# Patient Record
Sex: Female | Born: 1940 | Hispanic: No | Smoking: Never smoker
Health system: Southern US, Community
[De-identification: ages and names within clinical notes are randomized; demographics above are authoritative.]

## PROBLEM LIST (undated history)

## (undated) DIAGNOSIS — F329 Major depressive disorder, single episode, unspecified: Secondary | ICD-10-CM

## (undated) DIAGNOSIS — J410 Simple chronic bronchitis: Secondary | ICD-10-CM

## (undated) DIAGNOSIS — E119 Type 2 diabetes mellitus without complications: Secondary | ICD-10-CM

## (undated) DIAGNOSIS — F32A Depression, unspecified: Secondary | ICD-10-CM

## (undated) DIAGNOSIS — C3431 Malignant neoplasm of lower lobe, right bronchus or lung: Secondary | ICD-10-CM

## (undated) DIAGNOSIS — F419 Anxiety disorder, unspecified: Secondary | ICD-10-CM

## (undated) DIAGNOSIS — G4730 Sleep apnea, unspecified: Secondary | ICD-10-CM

## (undated) DIAGNOSIS — J181 Lobar pneumonia, unspecified organism: Secondary | ICD-10-CM

## (undated) DIAGNOSIS — R0600 Dyspnea, unspecified: Secondary | ICD-10-CM

## (undated) DIAGNOSIS — R053 Chronic cough: Secondary | ICD-10-CM

## (undated) DIAGNOSIS — J4540 Moderate persistent asthma, uncomplicated: Secondary | ICD-10-CM

## (undated) DIAGNOSIS — I10 Essential (primary) hypertension: Secondary | ICD-10-CM

## (undated) DIAGNOSIS — R05 Cough: Secondary | ICD-10-CM

## (undated) HISTORY — DX: Type 2 diabetes mellitus without complications: E11.9

## (undated) HISTORY — PX: TONSILLECTOMY: SUR1361

## (undated) HISTORY — DX: Simple chronic bronchitis: J41.0

## (undated) HISTORY — DX: Essential (primary) hypertension: I10

## (undated) HISTORY — DX: Moderate persistent asthma, uncomplicated: J45.40

## (undated) HISTORY — DX: Chronic cough: R05.3

## (undated) HISTORY — PX: BILATERAL SALPINGOOPHORECTOMY: SHX1223

## (undated) HISTORY — PX: APPENDECTOMY: SHX54

## (undated) HISTORY — DX: Malignant neoplasm of lower lobe, right bronchus or lung: C34.31

## (undated) HISTORY — DX: Lobar pneumonia, unspecified organism: J18.1

## (undated) HISTORY — PX: ABDOMINAL HYSTERECTOMY: SHX81

---
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
## 1998-04-15 ENCOUNTER — Other Ambulatory Visit: Admission: RE | Admit: 1998-04-15 | Discharge: 1998-04-15 | Payer: Self-pay | Admitting: Gynecology

## 1999-04-28 ENCOUNTER — Other Ambulatory Visit: Admission: RE | Admit: 1999-04-28 | Discharge: 1999-04-28 | Payer: Self-pay | Admitting: Gynecology

## 2000-05-03 ENCOUNTER — Other Ambulatory Visit: Admission: RE | Admit: 2000-05-03 | Discharge: 2000-05-03 | Payer: Self-pay | Admitting: Gynecology

## 2001-05-16 ENCOUNTER — Other Ambulatory Visit: Admission: RE | Admit: 2001-05-16 | Discharge: 2001-05-16 | Payer: Self-pay | Admitting: Gynecology

## 2002-06-12 ENCOUNTER — Other Ambulatory Visit: Admission: RE | Admit: 2002-06-12 | Discharge: 2002-06-12 | Payer: Self-pay | Admitting: Gynecology

## 2003-08-06 ENCOUNTER — Other Ambulatory Visit: Admission: RE | Admit: 2003-08-06 | Discharge: 2003-08-06 | Payer: Self-pay | Admitting: Gynecology

## 2004-08-25 ENCOUNTER — Other Ambulatory Visit: Admission: RE | Admit: 2004-08-25 | Discharge: 2004-08-25 | Payer: Self-pay | Admitting: Gynecology

## 2004-08-25 ENCOUNTER — Encounter: Admission: RE | Admit: 2004-08-25 | Discharge: 2004-08-25 | Payer: Self-pay | Admitting: Gynecology

## 2004-09-14 ENCOUNTER — Ambulatory Visit: Admission: RE | Admit: 2004-09-14 | Discharge: 2004-09-14 | Payer: Self-pay | Admitting: Gynecology

## 2004-09-21 ENCOUNTER — Inpatient Hospital Stay (HOSPITAL_COMMUNITY): Admission: RE | Admit: 2004-09-21 | Discharge: 2004-09-23 | Payer: Self-pay | Admitting: Gynecology

## 2004-10-26 ENCOUNTER — Ambulatory Visit: Admission: RE | Admit: 2004-10-26 | Discharge: 2004-10-26 | Payer: Self-pay | Admitting: Gynecology

## 2005-09-07 ENCOUNTER — Other Ambulatory Visit: Admission: RE | Admit: 2005-09-07 | Discharge: 2005-09-07 | Payer: Self-pay | Admitting: Gynecology

## 2005-10-12 ENCOUNTER — Encounter: Admission: RE | Admit: 2005-10-12 | Discharge: 2005-10-12 | Payer: Self-pay | Admitting: Gynecology

## 2006-10-25 ENCOUNTER — Other Ambulatory Visit: Admission: RE | Admit: 2006-10-25 | Discharge: 2006-10-25 | Payer: Self-pay | Admitting: Gynecology

## 2012-02-15 DIAGNOSIS — E559 Vitamin D deficiency, unspecified: Secondary | ICD-10-CM | POA: Diagnosis not present

## 2012-02-15 DIAGNOSIS — E119 Type 2 diabetes mellitus without complications: Secondary | ICD-10-CM | POA: Diagnosis not present

## 2012-02-15 DIAGNOSIS — E785 Hyperlipidemia, unspecified: Secondary | ICD-10-CM | POA: Diagnosis not present

## 2012-02-15 DIAGNOSIS — I10 Essential (primary) hypertension: Secondary | ICD-10-CM | POA: Diagnosis not present

## 2012-02-22 DIAGNOSIS — Z0000 Encounter for general adult medical examination without abnormal findings: Secondary | ICD-10-CM | POA: Diagnosis not present

## 2012-02-22 DIAGNOSIS — E119 Type 2 diabetes mellitus without complications: Secondary | ICD-10-CM | POA: Diagnosis not present

## 2012-02-22 DIAGNOSIS — E785 Hyperlipidemia, unspecified: Secondary | ICD-10-CM | POA: Diagnosis not present

## 2012-02-22 DIAGNOSIS — I10 Essential (primary) hypertension: Secondary | ICD-10-CM | POA: Diagnosis not present

## 2012-02-27 DIAGNOSIS — Z1212 Encounter for screening for malignant neoplasm of rectum: Secondary | ICD-10-CM | POA: Diagnosis not present

## 2012-05-17 DIAGNOSIS — H524 Presbyopia: Secondary | ICD-10-CM | POA: Diagnosis not present

## 2012-05-17 DIAGNOSIS — H2510 Age-related nuclear cataract, unspecified eye: Secondary | ICD-10-CM | POA: Diagnosis not present

## 2012-05-24 DIAGNOSIS — Z23 Encounter for immunization: Secondary | ICD-10-CM | POA: Diagnosis not present

## 2012-09-16 DIAGNOSIS — R21 Rash and other nonspecific skin eruption: Secondary | ICD-10-CM | POA: Diagnosis not present

## 2013-02-19 DIAGNOSIS — R059 Cough, unspecified: Secondary | ICD-10-CM | POA: Diagnosis not present

## 2013-02-19 DIAGNOSIS — R05 Cough: Secondary | ICD-10-CM | POA: Diagnosis not present

## 2013-03-07 DIAGNOSIS — I10 Essential (primary) hypertension: Secondary | ICD-10-CM | POA: Diagnosis not present

## 2013-03-07 DIAGNOSIS — E559 Vitamin D deficiency, unspecified: Secondary | ICD-10-CM | POA: Diagnosis not present

## 2013-03-07 DIAGNOSIS — E119 Type 2 diabetes mellitus without complications: Secondary | ICD-10-CM | POA: Diagnosis not present

## 2013-03-07 DIAGNOSIS — E785 Hyperlipidemia, unspecified: Secondary | ICD-10-CM | POA: Diagnosis not present

## 2013-03-14 DIAGNOSIS — Z1212 Encounter for screening for malignant neoplasm of rectum: Secondary | ICD-10-CM | POA: Diagnosis not present

## 2013-03-14 DIAGNOSIS — I10 Essential (primary) hypertension: Secondary | ICD-10-CM | POA: Diagnosis not present

## 2013-03-14 DIAGNOSIS — E785 Hyperlipidemia, unspecified: Secondary | ICD-10-CM | POA: Diagnosis not present

## 2013-03-14 DIAGNOSIS — E119 Type 2 diabetes mellitus without complications: Secondary | ICD-10-CM | POA: Diagnosis not present

## 2013-03-14 DIAGNOSIS — E559 Vitamin D deficiency, unspecified: Secondary | ICD-10-CM | POA: Diagnosis not present

## 2013-03-14 DIAGNOSIS — Z0000 Encounter for general adult medical examination without abnormal findings: Secondary | ICD-10-CM | POA: Diagnosis not present

## 2013-03-14 DIAGNOSIS — Z1331 Encounter for screening for depression: Secondary | ICD-10-CM | POA: Diagnosis not present

## 2013-03-14 DIAGNOSIS — Z6831 Body mass index (BMI) 31.0-31.9, adult: Secondary | ICD-10-CM | POA: Diagnosis not present

## 2013-03-14 DIAGNOSIS — Z79899 Other long term (current) drug therapy: Secondary | ICD-10-CM | POA: Diagnosis not present

## 2013-05-22 DIAGNOSIS — H524 Presbyopia: Secondary | ICD-10-CM | POA: Diagnosis not present

## 2013-05-22 DIAGNOSIS — E119 Type 2 diabetes mellitus without complications: Secondary | ICD-10-CM | POA: Diagnosis not present

## 2013-05-23 DIAGNOSIS — Z23 Encounter for immunization: Secondary | ICD-10-CM | POA: Diagnosis not present

## 2013-07-16 DIAGNOSIS — B351 Tinea unguium: Secondary | ICD-10-CM | POA: Diagnosis not present

## 2013-07-24 DIAGNOSIS — B351 Tinea unguium: Secondary | ICD-10-CM | POA: Diagnosis not present

## 2013-07-30 DIAGNOSIS — B351 Tinea unguium: Secondary | ICD-10-CM | POA: Diagnosis not present

## 2013-08-09 DIAGNOSIS — R197 Diarrhea, unspecified: Secondary | ICD-10-CM | POA: Diagnosis not present

## 2013-09-20 DIAGNOSIS — Z79899 Other long term (current) drug therapy: Secondary | ICD-10-CM | POA: Diagnosis not present

## 2013-09-20 DIAGNOSIS — B351 Tinea unguium: Secondary | ICD-10-CM | POA: Diagnosis not present

## 2014-03-19 DIAGNOSIS — E119 Type 2 diabetes mellitus without complications: Secondary | ICD-10-CM | POA: Diagnosis not present

## 2014-03-19 DIAGNOSIS — I10 Essential (primary) hypertension: Secondary | ICD-10-CM | POA: Diagnosis not present

## 2014-03-19 DIAGNOSIS — Z6830 Body mass index (BMI) 30.0-30.9, adult: Secondary | ICD-10-CM | POA: Diagnosis not present

## 2014-03-19 DIAGNOSIS — Z1331 Encounter for screening for depression: Secondary | ICD-10-CM | POA: Diagnosis not present

## 2014-03-19 DIAGNOSIS — Z780 Asymptomatic menopausal state: Secondary | ICD-10-CM | POA: Diagnosis not present

## 2014-03-19 DIAGNOSIS — Z79899 Other long term (current) drug therapy: Secondary | ICD-10-CM | POA: Diagnosis not present

## 2014-03-19 DIAGNOSIS — E559 Vitamin D deficiency, unspecified: Secondary | ICD-10-CM | POA: Diagnosis not present

## 2014-03-19 DIAGNOSIS — Z0000 Encounter for general adult medical examination without abnormal findings: Secondary | ICD-10-CM | POA: Diagnosis not present

## 2014-03-19 DIAGNOSIS — E785 Hyperlipidemia, unspecified: Secondary | ICD-10-CM | POA: Diagnosis not present

## 2014-03-25 DIAGNOSIS — Z1212 Encounter for screening for malignant neoplasm of rectum: Secondary | ICD-10-CM | POA: Diagnosis not present

## 2014-05-28 DIAGNOSIS — H2510 Age-related nuclear cataract, unspecified eye: Secondary | ICD-10-CM | POA: Diagnosis not present

## 2014-05-28 DIAGNOSIS — E119 Type 2 diabetes mellitus without complications: Secondary | ICD-10-CM | POA: Diagnosis not present

## 2014-06-18 DIAGNOSIS — Z23 Encounter for immunization: Secondary | ICD-10-CM | POA: Diagnosis not present

## 2015-04-02 DIAGNOSIS — Z1389 Encounter for screening for other disorder: Secondary | ICD-10-CM | POA: Diagnosis not present

## 2015-04-02 DIAGNOSIS — Z0000 Encounter for general adult medical examination without abnormal findings: Secondary | ICD-10-CM | POA: Diagnosis not present

## 2015-04-02 DIAGNOSIS — E785 Hyperlipidemia, unspecified: Secondary | ICD-10-CM | POA: Diagnosis not present

## 2015-04-02 DIAGNOSIS — M25561 Pain in right knee: Secondary | ICD-10-CM | POA: Diagnosis not present

## 2015-04-02 DIAGNOSIS — I10 Essential (primary) hypertension: Secondary | ICD-10-CM | POA: Diagnosis not present

## 2015-04-02 DIAGNOSIS — Z6829 Body mass index (BMI) 29.0-29.9, adult: Secondary | ICD-10-CM | POA: Diagnosis not present

## 2015-04-02 DIAGNOSIS — E559 Vitamin D deficiency, unspecified: Secondary | ICD-10-CM | POA: Diagnosis not present

## 2015-04-02 DIAGNOSIS — E119 Type 2 diabetes mellitus without complications: Secondary | ICD-10-CM | POA: Diagnosis not present

## 2015-04-03 DIAGNOSIS — Z1212 Encounter for screening for malignant neoplasm of rectum: Secondary | ICD-10-CM | POA: Diagnosis not present

## 2015-05-27 DIAGNOSIS — Z23 Encounter for immunization: Secondary | ICD-10-CM | POA: Diagnosis not present

## 2015-07-15 DIAGNOSIS — E119 Type 2 diabetes mellitus without complications: Secondary | ICD-10-CM | POA: Diagnosis not present

## 2015-07-15 DIAGNOSIS — H5213 Myopia, bilateral: Secondary | ICD-10-CM | POA: Diagnosis not present

## 2015-07-15 DIAGNOSIS — H524 Presbyopia: Secondary | ICD-10-CM | POA: Diagnosis not present

## 2016-04-07 DIAGNOSIS — E559 Vitamin D deficiency, unspecified: Secondary | ICD-10-CM | POA: Diagnosis not present

## 2016-04-07 DIAGNOSIS — M25561 Pain in right knee: Secondary | ICD-10-CM | POA: Diagnosis not present

## 2016-04-07 DIAGNOSIS — Z1389 Encounter for screening for other disorder: Secondary | ICD-10-CM | POA: Diagnosis not present

## 2016-04-07 DIAGNOSIS — E784 Other hyperlipidemia: Secondary | ICD-10-CM | POA: Diagnosis not present

## 2016-04-07 DIAGNOSIS — M25562 Pain in left knee: Secondary | ICD-10-CM | POA: Diagnosis not present

## 2016-04-07 DIAGNOSIS — Z6831 Body mass index (BMI) 31.0-31.9, adult: Secondary | ICD-10-CM | POA: Diagnosis not present

## 2016-04-07 DIAGNOSIS — I10 Essential (primary) hypertension: Secondary | ICD-10-CM | POA: Diagnosis not present

## 2016-04-07 DIAGNOSIS — Z0000 Encounter for general adult medical examination without abnormal findings: Secondary | ICD-10-CM | POA: Diagnosis not present

## 2016-04-07 DIAGNOSIS — E119 Type 2 diabetes mellitus without complications: Secondary | ICD-10-CM | POA: Diagnosis not present

## 2016-04-07 DIAGNOSIS — F5104 Psychophysiologic insomnia: Secondary | ICD-10-CM | POA: Diagnosis not present

## 2016-04-20 DIAGNOSIS — Z1212 Encounter for screening for malignant neoplasm of rectum: Secondary | ICD-10-CM | POA: Diagnosis not present

## 2016-05-03 DIAGNOSIS — I10 Essential (primary) hypertension: Secondary | ICD-10-CM | POA: Diagnosis not present

## 2016-05-05 DIAGNOSIS — I10 Essential (primary) hypertension: Secondary | ICD-10-CM | POA: Diagnosis not present

## 2016-06-04 DIAGNOSIS — Z23 Encounter for immunization: Secondary | ICD-10-CM | POA: Diagnosis not present

## 2016-06-15 DIAGNOSIS — G4733 Obstructive sleep apnea (adult) (pediatric): Secondary | ICD-10-CM | POA: Diagnosis not present

## 2016-07-20 DIAGNOSIS — H2513 Age-related nuclear cataract, bilateral: Secondary | ICD-10-CM | POA: Diagnosis not present

## 2016-07-20 DIAGNOSIS — H5213 Myopia, bilateral: Secondary | ICD-10-CM | POA: Diagnosis not present

## 2016-08-01 DIAGNOSIS — E669 Obesity, unspecified: Secondary | ICD-10-CM | POA: Diagnosis not present

## 2016-08-01 DIAGNOSIS — G4733 Obstructive sleep apnea (adult) (pediatric): Secondary | ICD-10-CM | POA: Diagnosis not present

## 2016-08-01 DIAGNOSIS — R0683 Snoring: Secondary | ICD-10-CM | POA: Diagnosis not present

## 2016-10-06 DIAGNOSIS — G4733 Obstructive sleep apnea (adult) (pediatric): Secondary | ICD-10-CM | POA: Diagnosis not present

## 2016-10-10 DIAGNOSIS — E784 Other hyperlipidemia: Secondary | ICD-10-CM | POA: Diagnosis not present

## 2016-10-10 DIAGNOSIS — E559 Vitamin D deficiency, unspecified: Secondary | ICD-10-CM | POA: Diagnosis not present

## 2016-10-10 DIAGNOSIS — E1151 Type 2 diabetes mellitus with diabetic peripheral angiopathy without gangrene: Secondary | ICD-10-CM | POA: Diagnosis not present

## 2016-10-10 DIAGNOSIS — I7389 Other specified peripheral vascular diseases: Secondary | ICD-10-CM | POA: Diagnosis not present

## 2016-10-10 DIAGNOSIS — Z1389 Encounter for screening for other disorder: Secondary | ICD-10-CM | POA: Diagnosis not present

## 2016-10-10 DIAGNOSIS — I10 Essential (primary) hypertension: Secondary | ICD-10-CM | POA: Diagnosis not present

## 2016-10-10 DIAGNOSIS — Z6830 Body mass index (BMI) 30.0-30.9, adult: Secondary | ICD-10-CM | POA: Diagnosis not present

## 2016-10-10 DIAGNOSIS — G4733 Obstructive sleep apnea (adult) (pediatric): Secondary | ICD-10-CM | POA: Diagnosis not present

## 2016-11-04 DIAGNOSIS — E118 Type 2 diabetes mellitus with unspecified complications: Secondary | ICD-10-CM | POA: Diagnosis not present

## 2017-01-19 DIAGNOSIS — E1165 Type 2 diabetes mellitus with hyperglycemia: Secondary | ICD-10-CM | POA: Diagnosis not present

## 2017-01-19 DIAGNOSIS — E669 Obesity, unspecified: Secondary | ICD-10-CM | POA: Diagnosis not present

## 2017-01-19 DIAGNOSIS — G4733 Obstructive sleep apnea (adult) (pediatric): Secondary | ICD-10-CM | POA: Diagnosis not present

## 2017-01-19 DIAGNOSIS — I10 Essential (primary) hypertension: Secondary | ICD-10-CM | POA: Diagnosis not present

## 2017-02-06 DIAGNOSIS — Z79899 Other long term (current) drug therapy: Secondary | ICD-10-CM | POA: Diagnosis not present

## 2017-02-06 DIAGNOSIS — E1165 Type 2 diabetes mellitus with hyperglycemia: Secondary | ICD-10-CM | POA: Diagnosis not present

## 2017-02-06 DIAGNOSIS — E785 Hyperlipidemia, unspecified: Secondary | ICD-10-CM | POA: Diagnosis not present

## 2017-02-10 DIAGNOSIS — E1165 Type 2 diabetes mellitus with hyperglycemia: Secondary | ICD-10-CM | POA: Diagnosis not present

## 2017-02-10 DIAGNOSIS — R05 Cough: Secondary | ICD-10-CM | POA: Diagnosis not present

## 2017-02-10 DIAGNOSIS — I10 Essential (primary) hypertension: Secondary | ICD-10-CM | POA: Diagnosis not present

## 2017-02-10 DIAGNOSIS — E669 Obesity, unspecified: Secondary | ICD-10-CM | POA: Diagnosis not present

## 2017-02-10 DIAGNOSIS — E785 Hyperlipidemia, unspecified: Secondary | ICD-10-CM | POA: Diagnosis not present

## 2017-05-12 DIAGNOSIS — Z79899 Other long term (current) drug therapy: Secondary | ICD-10-CM | POA: Diagnosis not present

## 2017-05-12 DIAGNOSIS — E1165 Type 2 diabetes mellitus with hyperglycemia: Secondary | ICD-10-CM | POA: Diagnosis not present

## 2017-05-17 DIAGNOSIS — E669 Obesity, unspecified: Secondary | ICD-10-CM | POA: Diagnosis not present

## 2017-05-17 DIAGNOSIS — E1165 Type 2 diabetes mellitus with hyperglycemia: Secondary | ICD-10-CM | POA: Diagnosis not present

## 2017-05-17 DIAGNOSIS — E785 Hyperlipidemia, unspecified: Secondary | ICD-10-CM | POA: Diagnosis not present

## 2017-05-17 DIAGNOSIS — I10 Essential (primary) hypertension: Secondary | ICD-10-CM | POA: Diagnosis not present

## 2017-07-20 DIAGNOSIS — H524 Presbyopia: Secondary | ICD-10-CM | POA: Diagnosis not present

## 2017-07-20 DIAGNOSIS — H2513 Age-related nuclear cataract, bilateral: Secondary | ICD-10-CM | POA: Diagnosis not present

## 2017-07-20 DIAGNOSIS — E119 Type 2 diabetes mellitus without complications: Secondary | ICD-10-CM | POA: Diagnosis not present

## 2017-08-01 DIAGNOSIS — I10 Essential (primary) hypertension: Secondary | ICD-10-CM | POA: Diagnosis not present

## 2017-08-01 DIAGNOSIS — G4733 Obstructive sleep apnea (adult) (pediatric): Secondary | ICD-10-CM | POA: Diagnosis not present

## 2017-08-01 DIAGNOSIS — E669 Obesity, unspecified: Secondary | ICD-10-CM | POA: Diagnosis not present

## 2017-11-14 DIAGNOSIS — E785 Hyperlipidemia, unspecified: Secondary | ICD-10-CM | POA: Diagnosis not present

## 2017-11-14 DIAGNOSIS — Z79899 Other long term (current) drug therapy: Secondary | ICD-10-CM | POA: Diagnosis not present

## 2017-11-14 DIAGNOSIS — E1165 Type 2 diabetes mellitus with hyperglycemia: Secondary | ICD-10-CM | POA: Diagnosis not present

## 2017-11-21 DIAGNOSIS — E1165 Type 2 diabetes mellitus with hyperglycemia: Secondary | ICD-10-CM | POA: Diagnosis not present

## 2017-11-21 DIAGNOSIS — R200 Anesthesia of skin: Secondary | ICD-10-CM | POA: Diagnosis not present

## 2017-11-21 DIAGNOSIS — I10 Essential (primary) hypertension: Secondary | ICD-10-CM | POA: Diagnosis not present

## 2017-11-21 DIAGNOSIS — E785 Hyperlipidemia, unspecified: Secondary | ICD-10-CM | POA: Diagnosis not present

## 2018-02-15 DIAGNOSIS — E785 Hyperlipidemia, unspecified: Secondary | ICD-10-CM | POA: Diagnosis not present

## 2018-02-15 DIAGNOSIS — Z79899 Other long term (current) drug therapy: Secondary | ICD-10-CM | POA: Diagnosis not present

## 2018-02-15 DIAGNOSIS — E1165 Type 2 diabetes mellitus with hyperglycemia: Secondary | ICD-10-CM | POA: Diagnosis not present

## 2018-02-20 DIAGNOSIS — E1165 Type 2 diabetes mellitus with hyperglycemia: Secondary | ICD-10-CM | POA: Diagnosis not present

## 2018-02-20 DIAGNOSIS — I10 Essential (primary) hypertension: Secondary | ICD-10-CM | POA: Diagnosis not present

## 2018-02-20 DIAGNOSIS — Z0000 Encounter for general adult medical examination without abnormal findings: Secondary | ICD-10-CM | POA: Diagnosis not present

## 2018-02-20 DIAGNOSIS — E785 Hyperlipidemia, unspecified: Secondary | ICD-10-CM | POA: Diagnosis not present

## 2018-03-19 DIAGNOSIS — I10 Essential (primary) hypertension: Secondary | ICD-10-CM | POA: Diagnosis not present

## 2018-03-19 DIAGNOSIS — L309 Dermatitis, unspecified: Secondary | ICD-10-CM | POA: Diagnosis not present

## 2018-05-08 DIAGNOSIS — I10 Essential (primary) hypertension: Secondary | ICD-10-CM | POA: Diagnosis not present

## 2018-05-08 DIAGNOSIS — L259 Unspecified contact dermatitis, unspecified cause: Secondary | ICD-10-CM | POA: Diagnosis not present

## 2018-06-04 DIAGNOSIS — Z23 Encounter for immunization: Secondary | ICD-10-CM | POA: Diagnosis not present

## 2018-06-05 DIAGNOSIS — H2513 Age-related nuclear cataract, bilateral: Secondary | ICD-10-CM | POA: Diagnosis not present

## 2018-06-05 DIAGNOSIS — E119 Type 2 diabetes mellitus without complications: Secondary | ICD-10-CM | POA: Diagnosis not present

## 2018-08-07 DIAGNOSIS — E1165 Type 2 diabetes mellitus with hyperglycemia: Secondary | ICD-10-CM | POA: Diagnosis not present

## 2018-08-07 DIAGNOSIS — I10 Essential (primary) hypertension: Secondary | ICD-10-CM | POA: Diagnosis not present

## 2018-08-07 DIAGNOSIS — E669 Obesity, unspecified: Secondary | ICD-10-CM | POA: Diagnosis not present

## 2018-08-07 DIAGNOSIS — G4733 Obstructive sleep apnea (adult) (pediatric): Secondary | ICD-10-CM | POA: Diagnosis not present

## 2018-08-07 DIAGNOSIS — E785 Hyperlipidemia, unspecified: Secondary | ICD-10-CM | POA: Diagnosis not present

## 2018-08-24 DIAGNOSIS — Z79899 Other long term (current) drug therapy: Secondary | ICD-10-CM | POA: Diagnosis not present

## 2018-08-24 DIAGNOSIS — E785 Hyperlipidemia, unspecified: Secondary | ICD-10-CM | POA: Diagnosis not present

## 2018-08-24 DIAGNOSIS — E1165 Type 2 diabetes mellitus with hyperglycemia: Secondary | ICD-10-CM | POA: Diagnosis not present

## 2018-08-27 DIAGNOSIS — R252 Cramp and spasm: Secondary | ICD-10-CM | POA: Diagnosis not present

## 2018-08-27 DIAGNOSIS — E785 Hyperlipidemia, unspecified: Secondary | ICD-10-CM | POA: Diagnosis not present

## 2018-08-27 DIAGNOSIS — E1165 Type 2 diabetes mellitus with hyperglycemia: Secondary | ICD-10-CM | POA: Diagnosis not present

## 2018-08-27 DIAGNOSIS — I10 Essential (primary) hypertension: Secondary | ICD-10-CM | POA: Diagnosis not present

## 2019-05-28 ENCOUNTER — Other Ambulatory Visit: Payer: Self-pay

## 2019-05-28 ENCOUNTER — Encounter: Payer: Self-pay | Admitting: *Deleted

## 2019-05-28 ENCOUNTER — Institutional Professional Consult (permissible substitution) (INDEPENDENT_AMBULATORY_CARE_PROVIDER_SITE_OTHER): Payer: Medicare Other | Admitting: Thoracic Surgery (Cardiothoracic Vascular Surgery)

## 2019-05-28 ENCOUNTER — Other Ambulatory Visit: Payer: Self-pay | Admitting: *Deleted

## 2019-05-28 ENCOUNTER — Encounter: Payer: Self-pay | Admitting: Thoracic Surgery (Cardiothoracic Vascular Surgery)

## 2019-05-28 VITALS — BP 128/79 | HR 106 | Temp 97.7°F | Ht 61.0 in | Wt 189.0 lb

## 2019-05-28 DIAGNOSIS — R053 Chronic cough: Secondary | ICD-10-CM | POA: Insufficient documentation

## 2019-05-28 DIAGNOSIS — J410 Simple chronic bronchitis: Secondary | ICD-10-CM | POA: Insufficient documentation

## 2019-05-28 DIAGNOSIS — I10 Essential (primary) hypertension: Secondary | ICD-10-CM

## 2019-05-28 DIAGNOSIS — E119 Type 2 diabetes mellitus without complications: Secondary | ICD-10-CM | POA: Insufficient documentation

## 2019-05-28 DIAGNOSIS — R05 Cough: Secondary | ICD-10-CM

## 2019-05-28 DIAGNOSIS — J181 Lobar pneumonia, unspecified organism: Secondary | ICD-10-CM

## 2019-05-28 DIAGNOSIS — C3431 Malignant neoplasm of lower lobe, right bronchus or lung: Secondary | ICD-10-CM

## 2019-05-28 DIAGNOSIS — J4540 Moderate persistent asthma, uncomplicated: Secondary | ICD-10-CM

## 2019-05-28 NOTE — Progress Notes (Signed)
PCP is Birdie Sons, MD Referring Provider is Birdie Sons, MD  Pulmonology: Elisabeth Most, MD, [place], [place] Oncology: Joselyn Glassman, MD [place], [place] Chief Complaint  Patient presents with  . Lung Cancer    RLLobe per [facility] 05/08/19.Marland KitchenMarland KitchenCT CHEST 8/18/PET 05/23/19.Marland KitchenMarland KitchenPFT 04/07/19    HPI: Cheyenne Hutchinson is sent for consultation regarding adenocarcinoma of the right lower lobe.  Cheyenne Hutchinson is a 78 year old non-smoker with a past medical history significant for hypertension, type II non-insulin-dependent diabetes without complication, asthma, and sleep apnea requiring CPAP at night.  She was in her usual state of health until the past winter.  In February she developed a persistent cough.  She was treated with antibiotics without improvement.  She had a chest x-ray which showed a probable right lower lobe pneumonia.  She was treated with Zithromax, but still did not improve.  She was referred to pulmonology, Dr Cheyenne Hutchinson.  A trial of Symbicort and Singulair was not helpful.  A repeat chest x-ray showed persistent consolidation.  A CT of the chest was done which showed extensive consolidation in the right lower lobe.  Bronchoscopy was done on 05/08/2019.  One fragment of her transbronchial biopsy showed well differentiated adenocarcinoma with lepidic growth.  A PET CT showed that area was hypermetabolic.  There was no significant hilar or mediastinal adenopathy.  She continues to have a persistent cough.  She says she will get short of breath when she walks up a flight of stairs.  She can complete the flight but then takes a second to catch her breath.  Pulmonary function testing by Dr. Albertine Hutchinson showed an FEV1 of 111% of predicted.  Her DLCO was 52% of predicted.  She is not having any chest pain, pressure, or tightness.  She has no history of cardiac issues.  He has lost 5 pounds over the past 3 months.   Past Medical History:  Diagnosis Date  . Chronic cough   . Consolidation of right lower lobe of lung  ([place])   . DM (diabetes mellitus) ([place])   . HTN (hypertension)   . Moderate persistent asthma without complication   . 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 Simple chronic bronchitis ([place])     Past Surgical History:  Procedure Laterality Date  . ABDOMINAL HYSTERECTOMY    . APPENDECTOMY    . BILATERAL SALPINGOOPHORECTOMY Bilateral     Family History  Problem Relation Age of Onset  . Alzheimer's disease Mother   . Stroke Mother   . Heart disease Father   . Throat cancer Brother   . Healthy Brother   . Healthy Child     Social History Social History   Tobacco Use  . Smoking status: Never Smoker  . Smokeless tobacco: Never Used  Substance Use Topics  . Alcohol use: Yes    Comment: once a month  . Drug use: Never    Current Outpatient Medications  Medication Sig Dispense Refill  . benzonatate (TESSALON) 200 MG capsule Take 200 mg by mouth 3 (three) times daily as needed for cough.    . Calcium Citrate (CITRACAL PO) Take 4 tablets by mouth daily. PETITES    . ciclopirox (PENLAC) 8 % solution Apply topically at bedtime. Apply over nail and surrounding skin. Apply daily over previous coat. After seven (7) days, may remove with alcohol and continue cycle.    . COD LIVER OIL PO Take 3 capsules by mouth daily.    Marland Kitchen guaiFENesin-codeine (  ROBITUSSIN AC) 100-10 MG/5ML syrup Take 5 mLs by mouth 3 (three) times daily as needed for cough.    . Magnesium 400 MG CAPS Take 2 capsules by mouth daily.    . Multiple Vitamin (MULTIVITAMIN) tablet Take 1 tablet by mouth daily. WOMEN'S ONE A DAY    . vitamin E 1000 UNIT capsule Take 1,000 Units by mouth daily.    . Zinc 50 MG TABS Take 1 tablet by mouth daily.    Marland Kitchen glimepiride (AMARYL) 1 MG tablet Take 1 mg by mouth daily with breakfast. OR WITH THE FIRST MAIN MEAL OF THE DAY    . losartan (COZAAR) 50 MG tablet Take 50 mg by mouth daily.    . metFORMIN (GLUCOPHAGE) 500 MG tablet Take by mouth 2 (two) times  daily with a meal.    . NON FORMULARY Cpap DME mask as directed nasal QHS    . pioglitazone (ACTOS) 15 MG tablet Take 15 mg by mouth daily.    . simvastatin (ZOCOR) 40 MG tablet Take 40 mg by mouth daily at 6 PM.     No current facility-administered medications for this visit.     Allergies  Allergen Reactions  . Aspirin Itching and Swelling  . Tetanus Toxoids Swelling    Review of Systems  Constitutional: Positive for activity change (Mild) and unexpected weight change (Lost 5 pounds in 3 months).  HENT: Positive for voice change (Hoarse voice). Negative for trouble swallowing.   Respiratory: Positive for apnea (CPAP at night), cough (Persistent nonproductive), shortness of breath (With exertion) and wheezing.   Cardiovascular: Negative for chest pain and leg swelling.  Gastrointestinal: Negative for abdominal distention and abdominal pain.  Genitourinary: Negative for difficulty urinating and dysuria.  Musculoskeletal: Positive for myalgias (Leg cramps). Negative for arthralgias.  Neurological: Negative for seizures, syncope and weakness.  Hematological: Negative for adenopathy. Does not bruise/bleed easily.    BP 128/79 (BP Location: Right Arm, Patient Position: Sitting, Cuff Size: Large)   Pulse (!) 106   Temp 97.7 F (36.5 C)   Ht 5\' 1"  (1.549 m)   Wt 189 lb (85.7 kg)   SpO2 96%   BMI 35.71 kg/m  Physical Exam Vitals signs reviewed.  Constitutional:      General: She is not in acute distress.    Comments: Frequent coughing  HENT:     Head: Normocephalic and atraumatic.     Comments: Wearing surgical mask Eyes:     General: No scleral icterus.    Extraocular Movements: Extraocular movements intact.  Neck:     Musculoskeletal: Neck supple.  Cardiovascular:     Rate and Rhythm: Regular rhythm. Tachycardia present.     Heart sounds: No murmur. No friction rub. No gallop.   Pulmonary:     Breath sounds: No wheezing or rales.     Comments: Absent breath sounds  right base Abdominal:     General: There is no distension.     Palpations: Abdomen is soft.  Musculoskeletal:        General: No swelling.  Lymphadenopathy:     Cervical: No cervical adenopathy.  Skin:    General: Skin is dry.  Neurological:     General: No focal deficit present.     Mental Status: She is alert and oriented to person, place, and time.     Cranial Nerves: No cranial nerve deficit.     Motor: No weakness.    Diagnostic Tests: I reviewed the CT and PET CT images  from her films done in [place].  I concur with the findings on the official readings noted below CT chest 04/23/2019 Impression 1.  Extensive consolidation of the right lower lobe, which does not appear to be substantially changed as compared to the June 2020 study.  Consider organizing pneumonia in the differential.  Follow-up chest CT is recommended after treatment to exclude underlying neoplastic process. 2.  Nonspecific peripheral and basilar predominant interstitial opacities likely relating to underlying interstitial lung disease.  PET CT 05/23/2019 Impression 1.  Heterogeneously hypermetabolic dependent consolidation in the right lower lobe which may be in keeping with history of primary lung carcinoma.  No convincing evidence for regional or distant hypermetabolic metastatic disease. 2.  Faint uptake in the small sub-solid lateral left upper lobe apical lung nodule, consider low-grade adenomatoid lesion. 3.  Mild reactive uptake in gastrohepatic lymph node, attention on routine follow-up.  Pulmonary function testing 04/26/2019 FVC 2.41 (103%) FEV1 1.93 (111%), no change with bronchodilator TLC 4.16 (90%) DLCO 18.4 (52%)  Impression: Cheyenne Hutchinson is a 78 year old woman with a past medical history of hypertension, type II non-insulin-dependent diabetes, asthma, and sleep apnea.  She is a lifelong non-smoker.  She developed a chronic nonproductive cough in February 2020.  That cough has persisted and  been refractory to treatment with antibiotics, bronchodilators, and inhaled steroids.  A chest x-ray showed dense consolidation in the right lower lobe.  CT of the chest also showed dense consolidation.  There is no significant adenopathy.  Bronchoscopy with transbronchial biopsy showed a small fragment of adenocarcinoma with lepidic spread.  PET/CT showed the consolidated area of the right lower lobe to be hypermetabolic.  There was no significant uptake in the hilar or mediastinal lymph nodes.  There was a small amount of uptake in a vague nodular area in the left upper lobe.  This is too small to characterize, but could possibly represent a second focus of disease.  T4, N0, stage IIIa adenocarcinoma right lower lobe.  Non-smoker with adenocarcinoma with lepidic spread.  This is a locally aggressive tumor.  There is no evidence of metastatic disease on her imaging.  It was not mentioned in the official report but there was a new right pleural effusion on the PET/CT which was not apparent on the CT of the chest month earlier.  We need to make sure this is not a malignant pleural effusion as that would preclude surgical resection.  If the pleural effusion is not malignant then she does have surgically resectable stage IIIa disease.  She would need multimodality treatment but would plan to proceed with lobectomy and then adjuvant therapy.  I discussed the possibility of right thoracotomy for right lower lobectomy with Cheyenne Hutchinson and her son.  They understand this is not a lesion that would be amenable to minimally invasive surgery.  I informed him of the general nature of the procedure, the incisions to be used, the expected hospital stay, and the overall recovery.  I informed him of the indications, risk, benefits, and alternatives.  They understand the risks include, but are not limited to death, MI, DVT, PE, bleeding, possible need for transfusion, infection, prolonged air leak, cardiac arrhythmias, as well  as the possibility of other upper stable complications.  She wishes to proceed with surgery.  Plan: Ultrasound-guided right thoracentesis to obtain fluid for cytology.  If cytology of pleural fluid negative, right thoracotomy for right lower lobectomy on Monday, June 10, 2019.  Melrose Nakayama, MD [facility] [phone]

## 2019-05-28 NOTE — H&P (View-Only) (Signed)
PCP is Birdie Sons, MD Referring Provider is Birdie Sons, MD  Pulmonology: Elisabeth Most, MD, [place], [place] Oncology: Joselyn Glassman, MD [place], [place] Chief Complaint  Patient presents with  . Lung Cancer    RLLobe per [facility] 05/08/19.Marland KitchenMarland KitchenCT CHEST 8/18/PET 05/23/19.Marland KitchenMarland KitchenPFT 04/07/19    HPI: [name] is sent for consultation regarding adenocarcinoma of the right lower lobe.  Cheyenne Hutchinson is a 78 year old non-smoker with a past medical history significant for hypertension, type II non-insulin-dependent diabetes without complication, asthma, and sleep apnea requiring CPAP at night.  She was in her usual state of health until the past winter.  In February she developed a persistent cough.  She was treated with antibiotics without improvement.  She had a chest x-ray which showed a probable right lower lobe pneumonia.  She was treated with Zithromax, but still did not improve.  She was referred to pulmonology, Dr Albertine Grates.  A trial of Symbicort and Singulair was not helpful.  A repeat chest x-ray showed persistent consolidation.  A CT of the chest was done which showed extensive consolidation in the right lower lobe.  Bronchoscopy was done on 05/08/2019.  One fragment of her transbronchial biopsy showed well differentiated adenocarcinoma with lepidic growth.  A PET CT showed that area was hypermetabolic.  There was no significant hilar or mediastinal adenopathy.  She continues to have a persistent cough.  She says she will get short of breath when she walks up a flight of stairs.  She can complete the flight but then takes a second to catch her breath.  Pulmonary function testing by Dr. Albertine Grates showed an FEV1 of 111% of predicted.  Her DLCO was 52% of predicted.  She is not having any chest pain, pressure, or tightness.  She has no history of cardiac issues.  He has lost 5 pounds over the past 3 months.   Past Medical History:  Diagnosis Date  . Chronic cough   . Consolidation of right lower lobe of lung  ([place])   . DM (diabetes mellitus) ([place])   . HTN (hypertension)   . Moderate persistent asthma without complication   . 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 Simple chronic bronchitis ([place])     Past Surgical History:  Procedure Laterality Date  . ABDOMINAL HYSTERECTOMY    . APPENDECTOMY    . BILATERAL SALPINGOOPHORECTOMY Bilateral     Family History  Problem Relation Age of Onset  . Alzheimer's disease Mother   . Stroke Mother   . Heart disease Father   . Throat cancer Brother   . Healthy Brother   . Healthy Child     Social History Social History   Tobacco Use  . Smoking status: Never Smoker  . Smokeless tobacco: Never Used  Substance Use Topics  . Alcohol use: Yes    Comment: once a month  . Drug use: Never    Current Outpatient Medications  Medication Sig Dispense Refill  . benzonatate (TESSALON) 200 MG capsule Take 200 mg by mouth 3 (three) times daily as needed for cough.    . Calcium Citrate (CITRACAL PO) Take 4 tablets by mouth daily. PETITES    . ciclopirox (PENLAC) 8 % solution Apply topically at bedtime. Apply over nail and surrounding skin. Apply daily over previous coat. After seven (7) days, may remove with alcohol and continue cycle.    . COD LIVER OIL PO Take 3 capsules by mouth daily.    Marland Kitchen guaiFENesin-codeine (  ROBITUSSIN AC) 100-10 MG/5ML syrup Take 5 mLs by mouth 3 (three) times daily as needed for cough.    . Magnesium 400 MG CAPS Take 2 capsules by mouth daily.    . Multiple Vitamin (MULTIVITAMIN) tablet Take 1 tablet by mouth daily. WOMEN'S ONE A DAY    . vitamin E 1000 UNIT capsule Take 1,000 Units by mouth daily.    . Zinc 50 MG TABS Take 1 tablet by mouth daily.    Marland Kitchen glimepiride (AMARYL) 1 MG tablet Take 1 mg by mouth daily with breakfast. OR WITH THE FIRST MAIN MEAL OF THE DAY    . losartan (COZAAR) 50 MG tablet Take 50 mg by mouth daily.    . metFORMIN (GLUCOPHAGE) 500 MG tablet Take by mouth 2 (two) times  daily with a meal.    . NON FORMULARY Cpap DME mask as directed nasal QHS    . pioglitazone (ACTOS) 15 MG tablet Take 15 mg by mouth daily.    . simvastatin (ZOCOR) 40 MG tablet Take 40 mg by mouth daily at 6 PM.     No current facility-administered medications for this visit.     Allergies  Allergen Reactions  . Aspirin Itching and Swelling  . Tetanus Toxoids Swelling    Review of Systems  Constitutional: Positive for activity change (Mild) and unexpected weight change (Lost 5 pounds in 3 months).  HENT: Positive for voice change (Hoarse voice). Negative for trouble swallowing.   Respiratory: Positive for apnea (CPAP at night), cough (Persistent nonproductive), shortness of breath (With exertion) and wheezing.   Cardiovascular: Negative for chest pain and leg swelling.  Gastrointestinal: Negative for abdominal distention and abdominal pain.  Genitourinary: Negative for difficulty urinating and dysuria.  Musculoskeletal: Positive for myalgias (Leg cramps). Negative for arthralgias.  Neurological: Negative for seizures, syncope and weakness.  Hematological: Negative for adenopathy. Does not bruise/bleed easily.    BP 128/79 (BP Location: Right Arm, Patient Position: Sitting, Cuff Size: Large)   Pulse (!) 106   Temp 97.7 F (36.5 C)   Ht 5\' 1"  (1.549 m)   Wt 189 lb (85.7 kg)   SpO2 96%   BMI 35.71 kg/m  Physical Exam Vitals signs reviewed.  Constitutional:      General: She is not in acute distress.    Comments: Frequent coughing  HENT:     Head: Normocephalic and atraumatic.     Comments: Wearing surgical mask Eyes:     General: No scleral icterus.    Extraocular Movements: Extraocular movements intact.  Neck:     Musculoskeletal: Neck supple.  Cardiovascular:     Rate and Rhythm: Regular rhythm. Tachycardia present.     Heart sounds: No murmur. No friction rub. No gallop.   Pulmonary:     Breath sounds: No wheezing or rales.     Comments: Absent breath sounds  right base Abdominal:     General: There is no distension.     Palpations: Abdomen is soft.  Musculoskeletal:        General: No swelling.  Lymphadenopathy:     Cervical: No cervical adenopathy.  Skin:    General: Skin is dry.  Neurological:     General: No focal deficit present.     Mental Status: She is alert and oriented to person, place, and time.     Cranial Nerves: No cranial nerve deficit.     Motor: No weakness.    Diagnostic Tests: I reviewed the CT and PET CT images  from her films done in [place].  I concur with the findings on the official readings noted below CT chest 04/23/2019 Impression 1.  Extensive consolidation of the right lower lobe, which does not appear to be substantially changed as compared to the June 2020 study.  Consider organizing pneumonia in the differential.  Follow-up chest CT is recommended after treatment to exclude underlying neoplastic process. 2.  Nonspecific peripheral and basilar predominant interstitial opacities likely relating to underlying interstitial lung disease.  PET CT 05/23/2019 Impression 1.  Heterogeneously hypermetabolic dependent consolidation in the right lower lobe which may be in keeping with history of primary lung carcinoma.  No convincing evidence for regional or distant hypermetabolic metastatic disease. 2.  Faint uptake in the small sub-solid lateral left upper lobe apical lung nodule, consider low-grade adenomatoid lesion. 3.  Mild reactive uptake in gastrohepatic lymph node, attention on routine follow-up.  Pulmonary function testing 04/26/2019 FVC 2.41 (103%) FEV1 1.93 (111%), no change with bronchodilator TLC 4.16 (90%) DLCO 18.4 (52%)  Impression: Cheyenne Hutchinson is a 78 year old woman with a past medical history of hypertension, type II non-insulin-dependent diabetes, asthma, and sleep apnea.  She is a lifelong non-smoker.  She developed a chronic nonproductive cough in February 2020.  That cough has persisted and  been refractory to treatment with antibiotics, bronchodilators, and inhaled steroids.  A chest x-ray showed dense consolidation in the right lower lobe.  CT of the chest also showed dense consolidation.  There is no significant adenopathy.  Bronchoscopy with transbronchial biopsy showed a small fragment of adenocarcinoma with lepidic spread.  PET/CT showed the consolidated area of the right lower lobe to be hypermetabolic.  There was no significant uptake in the hilar or mediastinal lymph nodes.  There was a small amount of uptake in a vague nodular area in the left upper lobe.  This is too small to characterize, but could possibly represent a second focus of disease.  T4, N0, stage IIIa adenocarcinoma right lower lobe.  Non-smoker with adenocarcinoma with lepidic spread.  This is a locally aggressive tumor.  There is no evidence of metastatic disease on her imaging.  It was not mentioned in the official report but there was a new right pleural effusion on the PET/CT which was not apparent on the CT of the chest month earlier.  We need to make sure this is not a malignant pleural effusion as that would preclude surgical resection.  If the pleural effusion is not malignant then she does have surgically resectable stage IIIa disease.  She would need multimodality treatment but would plan to proceed with lobectomy and then adjuvant therapy.  I discussed the possibility of right thoracotomy for right lower lobectomy with Mrs. Taflinger and her son.  They understand this is not a lesion that would be amenable to minimally invasive surgery.  I informed him of the general nature of the procedure, the incisions to be used, the expected hospital stay, and the overall recovery.  I informed him of the indications, risk, benefits, and alternatives.  They understand the risks include, but are not limited to death, MI, DVT, PE, bleeding, possible need for transfusion, infection, prolonged air leak, cardiac arrhythmias, as well  as the possibility of other upper stable complications.  She wishes to proceed with surgery.  Plan: Ultrasound-guided right thoracentesis to obtain fluid for cytology.  If cytology of pleural fluid negative, right thoracotomy for right lower lobectomy on Monday, June 10, 2019.  Melrose Nakayama, MD [facility] [phone]

## 2019-05-29 ENCOUNTER — Other Ambulatory Visit: Payer: Self-pay | Admitting: Thoracic Surgery (Cardiothoracic Vascular Surgery)

## 2019-05-29 ENCOUNTER — Encounter: Payer: Self-pay | Admitting: *Deleted

## 2019-05-29 DIAGNOSIS — J90 Pleural effusion, not elsewhere classified: Secondary | ICD-10-CM

## 2019-05-29 DIAGNOSIS — C3431 Malignant neoplasm of lower lobe, right bronchus or lung: Secondary | ICD-10-CM

## 2019-06-05 NOTE — Progress Notes (Signed)
[facility] DRUG STORE [place], [place] [facility] OF [place] [address] [place] [address] Phone: [phone] Fax: [phone]      Your procedure is scheduled on October 5th.  Report to [facility] Main Entrance "A" at 0730 A.M., and check in at the Admitting office.  Call this number if you have problems the morning of surgery:  [phone]  Call [phone] if you have any questions prior to your surgery date Monday-Friday 8am-4pm    Remember:  Do not eat or drink after midnight the night before your surgery     Take these medicines the morning of surgery with A SIP OF WATER    Hyoscyamine (Anaspaz)    Lorazepam (Ativan)- if needed  Benzonatate (Tessalon)- if needed  Guaifenesin- codeine (Robitussin AC)- if needed   As of today, STOP taking any Aspirin (unless otherwise instructed by your surgeon), Aleve, Naproxen, Ibuprofen, Motrin, Advil, Goody's, BC's, all herbal medications, fish oil, and all vitamins.    The Morning of Surgery  Do not wear jewelry, make-up or nail polish.  Do not wear lotions, powders, or perfumes, or deodorant  Do not shave 48 hours prior to surgery.    Do not bring valuables to the hospital.  [facility] is not responsible for any belongings or valuables.  If you are a smoker, DO NOT Smoke 24 hours prior to surgery IF you wear a CPAP at night please bring your mask, tubing, and machine the morning of surgery   Remember that you must have someone to transport you home after your surgery, and remain with you for 24 hours if you are discharged the same day.   Contacts, glasses, hearing aids, dentures or bridgework may not be worn into surgery.   Leave your suitcase in the car.  After surgery it may be brought to your room.  For patients admitted to the hospital, discharge time will be determined by your treatment team.  Patients discharged the day of surgery will not  be allowed to drive home.      WHAT DO I DO ABOUT MY DIABETES MEDICATION?   Do not take oral diabetes medicines (pills) the morning of surgery -- Pioglitazone (Actos), Metformin (Glucophage- XR), and glimepiride (AMARYL) .    How to Manage Your Diabetes Before and After Surgery  Why is it important to control my blood sugar before and after surgery? . Improving blood sugar levels before and after surgery helps healing and can limit problems. . A way of improving blood sugar control is eating a healthy diet by: o  Eating less sugar and carbohydrates o  Increasing activity/exercise o  Talking with your doctor about reaching your blood sugar goals . High blood sugars (greater than 180 mg/dL) can raise your risk of infections and slow your recovery, so you will need to focus on controlling your diabetes during the weeks before surgery. . Make sure that the doctor who takes care of your diabetes knows about your planned surgery including the date and location.  How do I manage my blood sugar before surgery? . Check your blood sugar at least 4 times a day, starting 2 days before surgery, to make sure that the level is not too high or low. o Check your blood sugar the morning of your surgery when you wake up and every 2 hours until you get to the Short Stay unit. . If your blood sugar is  less than 70 mg/dL, you will need to treat for low blood sugar: o Do not take insulin. o Treat a low blood sugar (less than 70 mg/dL) with  cup of clear juice (cranberry or apple), 4 glucose tablets, OR glucose gel. o Recheck blood sugar in 15 minutes after treatment (to make sure it is greater than 70 mg/dL). If your blood sugar is not greater than 70 mg/dL on recheck, call [phone] for further instructions. . Report your blood sugar to the short stay nurse when you get to Short Stay.  . If you are admitted to the hospital after surgery: o Your blood sugar will be checked by the staff and you will  probably be given insulin after surgery (instead of oral diabetes medicines) to make sure you have good blood sugar levels. o The goal for blood sugar control after surgery is 80-180 mg/dL.     Special instructions:   [facility]- Preparing For Surgery  Before surgery, you can play an important role. Because skin is not sterile, your skin needs to be as free of germs as possible. You can reduce the number of germs on your skin by washing with CHG (chlorahexidine gluconate) Soap before surgery.  CHG is an antiseptic cleaner which kills germs and bonds with the skin to continue killing germs even after washing.    Oral Hygiene is also important to reduce your risk of infection.  Remember - BRUSH YOUR TEETH THE MORNING OF SURGERY WITH YOUR REGULAR TOOTHPASTE  Please do not use if you have an allergy to CHG or antibacterial soaps. If your skin becomes reddened/irritated stop using the CHG.  Do not shave (including legs and underarms) for at least 48 hours prior to first CHG shower. It is OK to shave your face.  Please follow these instructions carefully.   1. Shower the NIGHT BEFORE SURGERY and the MORNING OF SURGERY with CHG Soap.   2. If you chose to wash your hair, wash your hair first as usual with your normal shampoo.  3. After you shampoo, rinse your hair and body thoroughly to remove the shampoo.  4. Use CHG as you would any other liquid soap. You can apply CHG directly to the skin and wash gently with a scrungie or a clean washcloth.   5. Apply the CHG Soap to your body ONLY FROM THE NECK DOWN.  Do not use on open wounds or open sores. Avoid contact with your eyes, ears, mouth and genitals (private parts). Wash Face and genitals (private parts)  with your normal soap.   6. Wash thoroughly, paying special attention to the area where your surgery will be performed.  7. Thoroughly rinse your body with warm water from the neck down.  8. DO NOT shower/wash with your normal soap after  using and rinsing off the CHG Soap.  9. Pat yourself dry with a CLEAN TOWEL.  10. Wear CLEAN PAJAMAS to bed the night before surgery, wear comfortable clothes the morning of surgery  11. Place CLEAN SHEETS on your bed the night of your first shower and DO NOT SLEEP WITH PETS.    Day of Surgery:  Do not apply any deodorants/lotions. Please shower the morning of surgery with the CHG soap  Please wear clean clothes to the hospital/surgery center.   Remember to brush your teeth WITH YOUR REGULAR TOOTHPASTE.   Please read over the following fact sheets that you were given.

## 2019-06-06 ENCOUNTER — Encounter (HOSPITAL_COMMUNITY)
Admission: RE | Admit: 2019-06-06 | Discharge: 2019-06-06 | Disposition: A | Discharge status: Home / Self Care | Payer: Medicare Other | Source: Ambulatory Visit | Attending: Thoracic Surgery (Cardiothoracic Vascular Surgery) | Admitting: Thoracic Surgery (Cardiothoracic Vascular Surgery)

## 2019-06-06 ENCOUNTER — Other Ambulatory Visit: Payer: Self-pay

## 2019-06-06 ENCOUNTER — Other Ambulatory Visit (HOSPITAL_COMMUNITY)
Admission: RE | Admit: 2019-06-06 | Discharge: 2019-06-06 | Disposition: A | Discharge status: Home / Self Care | Payer: Medicare Other | Source: Ambulatory Visit | Attending: Thoracic Surgery (Cardiothoracic Vascular Surgery) | Admitting: Thoracic Surgery (Cardiothoracic Vascular Surgery)

## 2019-06-06 ENCOUNTER — Encounter (HOSPITAL_COMMUNITY): Payer: Self-pay

## 2019-06-06 DIAGNOSIS — C3431 Malignant neoplasm of lower lobe, right bronchus or lung: Secondary | ICD-10-CM | POA: Diagnosis not present

## 2019-06-06 DIAGNOSIS — I10 Essential (primary) hypertension: Secondary | ICD-10-CM | POA: Insufficient documentation

## 2019-06-06 DIAGNOSIS — Z01812 Encounter for preprocedural laboratory examination: Secondary | ICD-10-CM | POA: Insufficient documentation

## 2019-06-06 DIAGNOSIS — Z20828 Contact with and (suspected) exposure to other viral communicable diseases: Secondary | ICD-10-CM | POA: Insufficient documentation

## 2019-06-06 DIAGNOSIS — R9431 Abnormal electrocardiogram [ECG] [EKG]: Secondary | ICD-10-CM | POA: Diagnosis not present

## 2019-06-06 HISTORY — DX: Anxiety disorder, unspecified: F41.9

## 2019-06-06 HISTORY — DX: Dyspnea, unspecified: R06.00

## 2019-06-06 HISTORY — DX: Sleep apnea, unspecified: G47.30

## 2019-06-06 HISTORY — DX: Depression, unspecified: F32.A

## 2019-06-06 LAB — BLOOD GAS, ARTERIAL
Acid-Base Excess: 0.8 mmol/L (ref 0.0–2.0)
Bicarbonate: 24.2 mmol/L (ref 20.0–28.0)
Drawn by: 421801
FIO2: 21
O2 Saturation: 97.8 %
Patient temperature: 98.6
pCO2 arterial: 34.3 mmHg (ref 32.0–48.0)
pH, Arterial: 7.463 — ABNORMAL HIGH (ref 7.350–7.450)
pO2, Arterial: 101 mmHg (ref 83.0–108.0)

## 2019-06-06 LAB — ABO/RH: ABO/RH(D): O POS

## 2019-06-06 LAB — CBC
HCT: 36.4 % (ref 36.0–46.0)
Hemoglobin: 11.4 g/dL — ABNORMAL LOW (ref 12.0–15.0)
MCH: 28.7 pg (ref 26.0–34.0)
MCHC: 31.3 g/dL (ref 30.0–36.0)
MCV: 91.7 fL (ref 80.0–100.0)
Platelets: 234 10*3/uL (ref 150–400)
RBC: 3.97 MIL/uL (ref 3.87–5.11)
RDW: 14.3 % (ref 11.5–15.5)
WBC: 6.3 10*3/uL (ref 4.0–10.5)
nRBC: 0 % (ref 0.0–0.2)

## 2019-06-06 LAB — URINALYSIS, ROUTINE W REFLEX MICROSCOPIC
Bilirubin Urine: NEGATIVE
Glucose, UA: NEGATIVE mg/dL
Hgb urine dipstick: NEGATIVE
Ketones, ur: NEGATIVE mg/dL
Leukocytes,Ua: NEGATIVE
Nitrite: NEGATIVE
Protein, ur: NEGATIVE mg/dL
Specific Gravity, Urine: 1.008 (ref 1.005–1.030)
pH: 7 (ref 5.0–8.0)

## 2019-06-06 LAB — COMPREHENSIVE METABOLIC PANEL
ALT: 16 U/L (ref 0–44)
AST: 22 U/L (ref 15–41)
Albumin: 3.3 g/dL — ABNORMAL LOW (ref 3.5–5.0)
Alkaline Phosphatase: 42 U/L (ref 38–126)
Anion gap: 12 (ref 5–15)
BUN: <5 mg/dL — ABNORMAL LOW (ref 8–23)
CO2: 21 mmol/L — ABNORMAL LOW (ref 22–32)
Calcium: 9.4 mg/dL (ref 8.9–10.3)
Chloride: 106 mmol/L (ref 98–111)
Creatinine, Ser: 0.68 mg/dL (ref 0.44–1.00)
GFR calc Af Amer: >60 mL/min (ref >60)
GFR calc non Af Amer: >60 mL/min (ref >60)
Glucose, Bld: 87 mg/dL (ref 70–99)
Potassium: 3.5 mmol/L (ref 3.5–5.1)
Sodium: 139 mmol/L (ref 135–145)
Total Bilirubin: 0.5 mg/dL (ref 0.3–1.2)
Total Protein: 6.7 g/dL (ref 6.5–8.1)

## 2019-06-06 LAB — TYPE AND SCREEN
ABO/RH(D): O POS
Antibody Screen: NEGATIVE

## 2019-06-06 LAB — SURGICAL PCR SCREEN
MRSA, PCR: NEGATIVE
Staphylococcus aureus: NEGATIVE

## 2019-06-06 LAB — PROTIME-INR
INR: 1.2 (ref 0.8–1.2)
Prothrombin Time: 15.3 seconds — ABNORMAL HIGH (ref 11.4–15.2)

## 2019-06-06 LAB — APTT: aPTT: 29 seconds (ref 24–36)

## 2019-06-06 LAB — GLUCOSE, CAPILLARY: Glucose-Capillary: 97 mg/dL (ref 70–99)

## 2019-06-06 NOTE — Progress Notes (Addendum)
PCP - Thermon Leyland, MD Cardiologist - Denies Endocrinologist- Denies Pulmonologist- Prescott Parma, MD  PPM/ICD - N/A Device Orders - N/A Rep Notified N/A  Chest x-ray - 06/06/2019 EKG - 06/06/2019 Stress Test - N/A ECHO - N/A Cardiac Cath -  N/A  Sleep Study - Yes CPAP - Yes  Fasting Blood Sugar - 130-140 Checks Blood Sugar __one__ time a day  Blood Thinner Instructions: N/A Aspirin Instructions: N/A  ERAS Protcol - N/A PRE-SURGERY Ensure - N/A  COVID TEST- 06/06/2019   Anesthesia review: Yes, recent thoracentesis 06/03/2019, bronchoscopy 05/08/2019, Abnormal EKG 06/06/2019  Patient denies shortness of breath, fever, cough and chest pain at PAT appointment   Coronavirus Screening  Have you experienced the following symptoms:  Cough yes/no: No Fever (>100.8F)  yes/no: No Runny nose yes/no: No Sore throat yes/no: No Difficulty breathing/shortness of breath  yes/no: No  Have you or a family member traveled in the last 14 days and where? yes/no: No   If the patient indicates "YES" to the above questions, their PAT will be rescheduled to limit the exposure to others and, the surgeon will be notified. THE PATIENT WILL NEED TO BE ASYMPTOMATIC FOR 14 DAYS.   If the patient is not experiencing any of these symptoms, the PAT nurse will instruct them to NOT bring anyone with them to their appointment since they may have these symptoms or traveled as well.   Please remind your patients and families that hospital visitation restrictions are in effect and the importance of the restrictions.     Patient verbalized understanding of instructions that were given to them at the PAT appointment. Patient was also instructed that they will need to review over the PAT instructions again at home before surgery.

## 2019-06-07 ENCOUNTER — Encounter (HOSPITAL_COMMUNITY): Payer: Self-pay

## 2019-06-07 ENCOUNTER — Other Ambulatory Visit: Payer: Self-pay | Admitting: Thoracic Surgery (Cardiothoracic Vascular Surgery)

## 2019-06-07 LAB — NOVEL CORONAVIRUS, NAA (HOSP ORDER, SEND-OUT TO REF LAB; TAT 18-24 HRS): SARS-CoV-2, NAA: NOT DETECTED

## 2019-06-07 NOTE — Progress Notes (Signed)
Anesthesia Chart Review:  Case: [id number] Date/Time: 06/10/19 0912   Procedure: THORACOTOMY/RIGHT LOWER LOBECTOMY (Right )   Anesthesia type: General   Pre-op diagnosis: RLL ADENOCARCINOMA   Location: [facility] OR ROOM 10 / [place] OR   Surgeon: Melrose Nakayama, MD      DISCUSSION: Patient is a 78 year old female scheduled for the above procedure. Persistent RLL opacity following treatment for cough. Bronchoscopy recommended and done on 05/08/19 with pathology positive for adenocarcinoma. Right thoracentesis on 06/03/19 showed: Reactive/degenerative mesothelial cells; no malignant cells identified ([place]).  History includes never smoker, right lung cancer (05/2019), chronic bronchitis, asthma, DM2, HTN, dyspnea, anxiety, OSA (CPAP).  BMI is consistent with obesity.  According to 05/28/19 note by Dr. Roxan Hockey, "She continues to have a persistent cough.  She says she will get short of breath when she walks up a flight of stairs.  She can complete the flight but then takes a second to catch her breath.  Pulmonary function testing by Dr. Albertine Grates showed an FEV1 of 111% of predicted.  Her DLCO was 52% of predicted.  She is not having any chest pain, pressure, or tightness.  She has no history of cardiac issues.  He has lost 5 pounds over the past 3 months."  Preoperative labs acceptable.  A1c 6.6% on 03/06/19 ([facility]). EKG showed normal sinus rhythm with nonspecific T wave abnormality.  Patient denies shortness of breath, fever, cough and chest pain at PAT appointment.  Based on currently available information I would anticipate that she could proceed as planned if no acute changes.   VS: BP (!) 141/78   Pulse 100   Temp 37 C (Oral)   Resp 18   Ht 5\' 1"  (1.549 m)   Wt 85.3 kg   SpO2 97%   BMI 35.52 kg/m   PROVIDERS: Birdie Sons, MD is PCP Prescott Parma, MD is pulmonologist [place], [place]) Jaye Beagle. Swetja. MD is HEM-[facility] Dorthula Rue, [place]; see [place])   LABS: Labs  reviewed: Acceptable for surgery. (all labs ordered are listed, but only abnormal results are displayed)  Labs Reviewed  BLOOD GAS, ARTERIAL - Abnormal; Notable for the following components:      Result Value   pH, Arterial 7.463 (*)    All other components within normal limits  CBC - Abnormal; Notable for the following components:   Hemoglobin 11.4 (*)    All other components within normal limits  COMPREHENSIVE METABOLIC PANEL - Abnormal; Notable for the following components:   CO2 21 (*)    BUN <5 (*)    Albumin 3.3 (*)    All other components within normal limits  PROTIME-INR - Abnormal; Notable for the following components:   Prothrombin Time 15.3 (*)    All other components within normal limits  SURGICAL PCR SCREEN  GLUCOSE, CAPILLARY  APTT  URINALYSIS, ROUTINE W REFLEX MICROSCOPIC  TYPE AND SCREEN  ABO/RH     IMAGES: CXR 06/06/2019: IMPRESSION: Masslike opacity involving the right lower lobe extending to the pleura. Correlate with patient's reported history of malignancy. No comparison images are available in the system at time of Interpretation.  CT chest 04/23/2019 ([facility]) Impression 1.  Extensive consolidation of the right lower lobe, which does not appear to be substantially changed as compared to the June 2020 study.  Consider organizing pneumonia in the differential.  Follow-up chest CT is recommended after treatment to exclude underlying neoplastic process. 2.  Nonspecific peripheral and basilar predominant interstitial opacities likely relating to underlying  interstitial lung disease.  PET CT 05/23/2019 ([facility]) Impression 1.  Heterogeneously hypermetabolic dependent consolidation in the right lower lobe which may be in keeping with history of primary lung carcinoma.  No convincing evidence for regional or distant hypermetabolic metastatic disease. 2.  Faint uptake in the small sub-solid lateral left upper lobe apical lung nodule, consider low-grade  adenomatoid lesion. 3.  Mild reactive uptake in gastrohepatic lymph node, attention on routine follow-up.  Pulmonary function testing 04/26/2019 (as outlined by Dr. Roxan Hockey) FVC 2.41 (103%) FEV1 1.93 (111%), no change with bronchodilator TLC 4.16 (90%) DLCO 18.4 (52%   EKG: 06/06/2019: Normal sinus rhythm Nonspecific T wave abnormality Abnormal ECG No significant change since 2006 Confirmed by Jenkins [place] [id number]) on 06/07/2019 8:09:23 AM   CV: N/A   Past Medical History:  Diagnosis Date  . Anxiety   . Chronic cough   . Consolidation of right lower lobe of lung ([place])   . Depression    40 years ago tx for depression  . DM (diabetes mellitus) ([place])   . Dyspnea   . HTN (hypertension)   . Moderate persistent asthma without complication   . 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 Simple chronic bronchitis ([place])   . Sleep apnea     Past Surgical History:  Procedure Laterality Date  . ABDOMINAL HYSTERECTOMY    . APPENDECTOMY    . BILATERAL SALPINGOOPHORECTOMY Bilateral   . TONSILLECTOMY     at 78 years old    MEDICATIONS: . benzonatate (TESSALON) 200 MG capsule  . Calcium Citrate (CITRACAL PO)  . ciclopirox (PENLAC) 8 % solution  . COD LIVER OIL PO  . glimepiride (AMARYL) 1 MG tablet  . guaiFENesin-codeine (ROBITUSSIN AC) 100-10 MG/5ML syrup  . hyoscyamine (ANASPAZ) 0.125 MG TBDP disintergrating tablet  . LORazepam (ATIVAN) 0.5 MG tablet  . losartan (COZAAR) 50 MG tablet  . Magnesium 400 MG CAPS  . metFORMIN (GLUCOPHAGE-XR) 500 MG 24 hr tablet  . Multiple Vitamin (MULTIVITAMIN) tablet  . NON FORMULARY  . pioglitazone (ACTOS) 15 MG tablet  . simvastatin (ZOCOR) 40 MG tablet  . vitamin E 400 UNIT capsule  . Zinc 50 MG TABS   No current facility-administered medications for this encounter.     Myra Gianotti, PA-C Surgical Short Stay/Anesthesiology [facility] Phone [phone] [facility] Phone [phone] 06/07/2019 2:13  PM

## 2019-06-07 NOTE — Anesthesia Preprocedure Evaluation (Addendum)
Anesthesia Evaluation  Patient identified by MRN, date of birth, ID band Patient awake    Reviewed: Allergy & Precautions, NPO status , Patient's Chart, lab work & pertinent test results  Airway Mallampati: II  TM Distance: >3 FB Neck ROM: Full    Dental  (+) Teeth Intact, Dental Advisory Given   Pulmonary asthma , sleep apnea ,     + decreased breath sounds      Cardiovascular hypertension, Pt. on medications  Rhythm:Regular Rate:Normal     Neuro/Psych Anxiety Depression    GI/Hepatic negative GI ROS,   Endo/Other  diabetes, Type 2, Oral Hypoglycemic Agents  Renal/GU      Musculoskeletal   Abdominal   Peds  Hematology   Anesthesia Other Findings   Reproductive/Obstetrics                            Anesthesia Physical Anesthesia Plan  ASA: III  Anesthesia Plan: General   Post-op Pain Management:    Induction: Intravenous  PONV Risk Score and Plan: 4 or greater and Ondansetron, Dexamethasone and Treatment may vary due to age or medical condition  Airway Management Planned: Double Lumen EBT  Additional Equipment: Arterial line, CVP and Ultrasound Guidance Line Placement  Intra-op Plan:   Post-operative Plan: Possible Post-op intubation/ventilation  Informed Consent: I have reviewed the patients History and Physical, chart, labs and discussed the procedure including the risks, benefits and alternatives for the proposed anesthesia with the patient or authorized representative who has indicated his/her understanding and acceptance.     Dental advisory given  Plan Discussed with: CRNA  Anesthesia Plan Comments: (PAT note written 06/07/2019 by Myra Gianotti, PA-C. )       Anesthesia Quick Evaluation

## 2019-06-10 ENCOUNTER — Encounter (HOSPITAL_COMMUNITY)
Admission: RE | Disposition: A | Discharge status: Home / Self Care | Payer: Self-pay | Source: Home / Self Care | Attending: Thoracic Surgery (Cardiothoracic Vascular Surgery)

## 2019-06-10 ENCOUNTER — Other Ambulatory Visit: Payer: Self-pay

## 2019-06-10 ENCOUNTER — Inpatient Hospital Stay: Payer: Self-pay

## 2019-06-10 ENCOUNTER — Encounter (HOSPITAL_COMMUNITY): Payer: Self-pay

## 2019-06-10 ENCOUNTER — Inpatient Hospital Stay (HOSPITAL_COMMUNITY)
Admission: RE | Admit: 2019-06-10 | Discharge: 2019-06-14 | DRG: 166 | Disposition: A | Discharge status: Home / Self Care | Payer: Medicare Other | Attending: Thoracic Surgery (Cardiothoracic Vascular Surgery) | Admitting: Thoracic Surgery (Cardiothoracic Vascular Surgery)

## 2019-06-10 ENCOUNTER — Inpatient Hospital Stay (HOSPITAL_COMMUNITY): Payer: Medicare Other | Admitting: Physician Assistant

## 2019-06-10 ENCOUNTER — Inpatient Hospital Stay (HOSPITAL_COMMUNITY): Payer: Medicare Other

## 2019-06-10 ENCOUNTER — Inpatient Hospital Stay (HOSPITAL_COMMUNITY): Payer: Medicare Other | Admitting: Certified Registered Nurse Anesthetist

## 2019-06-10 DIAGNOSIS — I10 Essential (primary) hypertension: Secondary | ICD-10-CM | POA: Diagnosis present

## 2019-06-10 DIAGNOSIS — J90 Pleural effusion, not elsewhere classified: Secondary | ICD-10-CM | POA: Diagnosis present

## 2019-06-10 DIAGNOSIS — Z7984 Long term (current) use of oral hypoglycemic drugs: Secondary | ICD-10-CM

## 2019-06-10 DIAGNOSIS — Z79899 Other long term (current) drug therapy: Secondary | ICD-10-CM | POA: Diagnosis not present

## 2019-06-10 DIAGNOSIS — C3491 Malignant neoplasm of unspecified part of right bronchus or lung: Secondary | ICD-10-CM | POA: Diagnosis present

## 2019-06-10 DIAGNOSIS — J189 Pneumonia, unspecified organism: Secondary | ICD-10-CM | POA: Diagnosis present

## 2019-06-10 DIAGNOSIS — J4540 Moderate persistent asthma, uncomplicated: Secondary | ICD-10-CM | POA: Diagnosis present

## 2019-06-10 DIAGNOSIS — C3431 Malignant neoplasm of lower lobe, right bronchus or lung: Principal | ICD-10-CM | POA: Diagnosis present

## 2019-06-10 DIAGNOSIS — E119 Type 2 diabetes mellitus without complications: Secondary | ICD-10-CM | POA: Diagnosis present

## 2019-06-10 DIAGNOSIS — Z90710 Acquired absence of both cervix and uterus: Secondary | ICD-10-CM

## 2019-06-10 DIAGNOSIS — D62 Acute posthemorrhagic anemia: Secondary | ICD-10-CM | POA: Diagnosis not present

## 2019-06-10 DIAGNOSIS — R05 Cough: Secondary | ICD-10-CM | POA: Diagnosis present

## 2019-06-10 DIAGNOSIS — Z4682 Encounter for fitting and adjustment of non-vascular catheter: Secondary | ICD-10-CM

## 2019-06-10 DIAGNOSIS — G4730 Sleep apnea, unspecified: Secondary | ICD-10-CM | POA: Diagnosis present

## 2019-06-10 HISTORY — PX: VIDEO ASSISTED THORACOSCOPY (VATS) W/TALC PLEUADESIS: SHX6168

## 2019-06-10 HISTORY — PX: PLEURAL EFFUSION DRAINAGE: SHX5099

## 2019-06-10 HISTORY — PX: NODE DISSECTION: SHX5269

## 2019-06-10 LAB — GLUCOSE, CAPILLARY
Glucose-Capillary: 109 mg/dL — ABNORMAL HIGH (ref 70–99)
Glucose-Capillary: 128 mg/dL — ABNORMAL HIGH (ref 70–99)
Glucose-Capillary: 192 mg/dL — ABNORMAL HIGH (ref 70–99)
Glucose-Capillary: 253 mg/dL — ABNORMAL HIGH (ref 70–99)

## 2019-06-10 SURGERY — VIDEO ASSISTED THORACOSCOPY (VATS) W/TALC PLEUADESIS
Anesthesia: General | Site: Chest | Laterality: Right

## 2019-06-10 MED ORDER — KCL IN DEXTROSE-NACL 20-5-0.9 MEQ/L-%-% IV SOLN
INTRAVENOUS | Status: DC
  Administered 2019-06-10: 15:00:00 via INTRAVENOUS
  Filled 2019-06-10 (×2): qty 1000

## 2019-06-10 MED ORDER — SODIUM CHLORIDE 0.9 % IV SOLN: INTRAVENOUS | Status: DC | PRN

## 2019-06-10 MED ORDER — 0.9 % SODIUM CHLORIDE (POUR BTL) OPTIME
TOPICAL | Status: DC | PRN
  Administered 2019-06-10 (×2): 1000 mL

## 2019-06-10 MED ORDER — BISACODYL 5 MG PO TBEC
10.0000 mg | DELAYED_RELEASE_TABLET | Freq: Every day | ORAL | Status: DC
  Administered 2019-06-12: 10 mg via ORAL
  Filled 2019-06-10 (×3): qty 2

## 2019-06-10 MED ORDER — LUNG SURGERY BOOK
Freq: Once | Status: AC
  Administered 2019-06-10: 1
  Filled 2019-06-10: qty 1

## 2019-06-10 MED ORDER — LORAZEPAM 0.5 MG PO TABS: 0.5000 mg | ORAL_TABLET | Freq: Two times a day (BID) | ORAL | Status: DC | PRN

## 2019-06-10 MED ORDER — ACETAMINOPHEN 160 MG/5ML PO SOLN: 325.0000 mg | Freq: Once | ORAL | Status: DC | PRN

## 2019-06-10 MED ORDER — TALC 5 G PL SUSR
INTRAPLEURAL | Status: DC | PRN
  Administered 2019-06-10: 4 g via INTRAPLEURAL

## 2019-06-10 MED ORDER — ONDANSETRON HCL 4 MG/2ML IJ SOLN
INTRAMUSCULAR | Status: DC | PRN
  Administered 2019-06-10: 4 mg via INTRAVENOUS

## 2019-06-10 MED ORDER — MIDAZOLAM HCL 2 MG/2ML IJ SOLN
INTRAMUSCULAR | Status: DC | PRN
  Administered 2019-06-10 (×2): 1 mg via INTRAVENOUS

## 2019-06-10 MED ORDER — PHENYLEPHRINE HCL (PRESSORS) 10 MG/ML IV SOLN
INTRAVENOUS | Status: AC
  Filled 2019-06-10: qty 1

## 2019-06-10 MED ORDER — CHLORHEXIDINE GLUCONATE CLOTH 2 % EX PADS
6.0000 | MEDICATED_PAD | Freq: Every day | CUTANEOUS | Status: DC
  Administered 2019-06-11: 6 via TOPICAL

## 2019-06-10 MED ORDER — OXYCODONE HCL 5 MG PO TABS: 5.0000 mg | ORAL_TABLET | ORAL | Status: DC | PRN

## 2019-06-10 MED ORDER — TRAMADOL HCL 50 MG PO TABS
50.0000 mg | ORAL_TABLET | Freq: Four times a day (QID) | ORAL | Status: DC | PRN
  Administered 2019-06-10: 50 mg via ORAL
  Administered 2019-06-10 – 2019-06-11 (×3): 100 mg via ORAL
  Administered 2019-06-12: 50 mg via ORAL
  Filled 2019-06-10 (×3): qty 2
  Filled 2019-06-10: qty 1

## 2019-06-10 MED ORDER — HYDROMORPHONE HCL 1 MG/ML IJ SOLN
INTRAMUSCULAR | Status: AC
  Filled 2019-06-10: qty 0.5

## 2019-06-10 MED ORDER — LACTATED RINGERS IV SOLN: INTRAVENOUS | Status: DC

## 2019-06-10 MED ORDER — ORAL CARE MOUTH RINSE
15.0000 mL | Freq: Two times a day (BID) | OROMUCOSAL | Status: DC
  Administered 2019-06-10 – 2019-06-13 (×5): 15 mL via OROMUCOSAL

## 2019-06-10 MED ORDER — LACTATED RINGERS IV SOLN
INTRAVENOUS | Status: DC | PRN
  Administered 2019-06-10: 09:00:00 via INTRAVENOUS

## 2019-06-10 MED ORDER — BUPIVACAINE LIPOSOME 1.3 % IJ SUSP
20.0000 mL | INTRAMUSCULAR | Status: AC
  Administered 2019-06-10: 20 mL
  Filled 2019-06-10: qty 20

## 2019-06-10 MED ORDER — FENTANYL CITRATE (PF) 250 MCG/5ML IJ SOLN
INTRAMUSCULAR | Status: AC
  Filled 2019-06-10: qty 5

## 2019-06-10 MED ORDER — FENTANYL CITRATE (PF) 250 MCG/5ML IJ SOLN
INTRAMUSCULAR | Status: DC | PRN
  Administered 2019-06-10 (×2): 100 ug via INTRAVENOUS
  Administered 2019-06-10: 50 ug via INTRAVENOUS

## 2019-06-10 MED ORDER — HYDROMORPHONE HCL 1 MG/ML IJ SOLN
0.2500 mg | INTRAMUSCULAR | Status: DC | PRN
  Administered 2019-06-10 (×2): 0.5 mg via INTRAVENOUS

## 2019-06-10 MED ORDER — BENZONATATE 100 MG PO CAPS: 200.0000 mg | ORAL_CAPSULE | Freq: Three times a day (TID) | ORAL | Status: DC | PRN

## 2019-06-10 MED ORDER — ACETAMINOPHEN 10 MG/ML IV SOLN: 1000.0000 mg | Freq: Once | INTRAVENOUS | Status: DC | PRN

## 2019-06-10 MED ORDER — VITAMIN E 180 MG (400 UNIT) PO CAPS
400.0000 [IU] | ORAL_CAPSULE | Freq: Every day | ORAL | Status: DC
  Administered 2019-06-11 – 2019-06-14 (×4): 400 [IU] via ORAL
  Filled 2019-06-10 (×4): qty 1

## 2019-06-10 MED ORDER — PROPOFOL 10 MG/ML IV BOLUS
INTRAVENOUS | Status: AC
  Filled 2019-06-10: qty 20

## 2019-06-10 MED ORDER — HYDROMORPHONE HCL 1 MG/ML IJ SOLN
INTRAMUSCULAR | Status: AC
  Administered 2019-06-10: 0.5 mg via INTRAVENOUS
  Filled 2019-06-10: qty 1

## 2019-06-10 MED ORDER — TALC (STERITALC) POWDER FOR INTRAPLEURAL USE
INTRAPLEURAL | Status: AC
  Filled 2019-06-10: qty 4

## 2019-06-10 MED ORDER — CEFAZOLIN SODIUM-DEXTROSE 2-4 GM/100ML-% IV SOLN
2.0000 g | INTRAVENOUS | Status: AC
  Administered 2019-06-10: 10:00:00 2 g via INTRAVENOUS
  Filled 2019-06-10: qty 100

## 2019-06-10 MED ORDER — BUPIVACAINE HCL (PF) 0.5 % IJ SOLN
INTRAMUSCULAR | Status: AC
  Filled 2019-06-10: qty 30

## 2019-06-10 MED ORDER — BUPIVACAINE HCL 0.5 % IJ SOLN
INTRAMUSCULAR | Status: DC | PRN
  Administered 2019-06-10: 30 mL

## 2019-06-10 MED ORDER — ALBUTEROL SULFATE (2.5 MG/3ML) 0.083% IN NEBU
2.5000 mg | INHALATION_SOLUTION | RESPIRATORY_TRACT | Status: DC
  Administered 2019-06-10 (×2): 2.5 mg via RESPIRATORY_TRACT
  Filled 2019-06-10 (×2): qty 3

## 2019-06-10 MED ORDER — GUAIFENESIN-CODEINE 100-10 MG/5ML PO SOLN: 5.0000 mL | Freq: Three times a day (TID) | ORAL | Status: DC | PRN

## 2019-06-10 MED ORDER — PROPOFOL 10 MG/ML IV BOLUS
INTRAVENOUS | Status: DC | PRN
  Administered 2019-06-10: 100 mg via INTRAVENOUS
  Administered 2019-06-10: 50 mg via INTRAVENOUS

## 2019-06-10 MED ORDER — DIPHENHYDRAMINE HCL 50 MG/ML IJ SOLN
INTRAMUSCULAR | Status: DC | PRN
  Administered 2019-06-10: 12.5 mg via INTRAVENOUS

## 2019-06-10 MED ORDER — ENOXAPARIN SODIUM 40 MG/0.4ML ~~LOC~~ SOLN
40.0000 mg | Freq: Every day | SUBCUTANEOUS | Status: DC
  Administered 2019-06-11 – 2019-06-13 (×3): 40 mg via SUBCUTANEOUS
  Filled 2019-06-10 (×3): qty 0.4

## 2019-06-10 MED ORDER — INSULIN ASPART 100 UNIT/ML ~~LOC~~ SOLN
0.0000 [IU] | Freq: Three times a day (TID) | SUBCUTANEOUS | Status: DC
  Administered 2019-06-10: 4 [IU] via SUBCUTANEOUS
  Administered 2019-06-10: 8 [IU] via SUBCUTANEOUS
  Administered 2019-06-11 – 2019-06-12 (×3): 2 [IU] via SUBCUTANEOUS
  Administered 2019-06-12: 4 [IU] via SUBCUTANEOUS
  Administered 2019-06-13: 17:00:00 2 [IU] via SUBCUTANEOUS

## 2019-06-10 MED ORDER — SUCCINYLCHOLINE CHLORIDE 200 MG/10ML IV SOSY
PREFILLED_SYRINGE | INTRAVENOUS | Status: DC | PRN
  Administered 2019-06-10: 140 mg via INTRAVENOUS

## 2019-06-10 MED ORDER — ROCURONIUM BROMIDE 10 MG/ML (PF) SYRINGE
PREFILLED_SYRINGE | INTRAVENOUS | Status: DC | PRN
  Administered 2019-06-10: 60 mg via INTRAVENOUS

## 2019-06-10 MED ORDER — HYDROMORPHONE HCL 1 MG/ML IJ SOLN
INTRAMUSCULAR | Status: DC | PRN
  Administered 2019-06-10: 0.5 mg via INTRAVENOUS

## 2019-06-10 MED ORDER — DEXAMETHASONE SODIUM PHOSPHATE 10 MG/ML IJ SOLN
INTRAMUSCULAR | Status: DC | PRN
  Administered 2019-06-10: 10 mg via INTRAVENOUS

## 2019-06-10 MED ORDER — HEMOSTATIC AGENTS (NO CHARGE) OPTIME
TOPICAL | Status: DC | PRN
  Administered 2019-06-10: 1 via TOPICAL

## 2019-06-10 MED ORDER — ACETAMINOPHEN 500 MG PO TABS
1000.0000 mg | ORAL_TABLET | Freq: Four times a day (QID) | ORAL | Status: DC
  Administered 2019-06-10 – 2019-06-14 (×15): 1000 mg via ORAL
  Filled 2019-06-10 (×15): qty 2

## 2019-06-10 MED ORDER — ACETAMINOPHEN 160 MG/5ML PO SOLN: 1000.0000 mg | Freq: Four times a day (QID) | ORAL | Status: DC

## 2019-06-10 MED ORDER — LACTATED RINGERS IV SOLN
INTRAVENOUS | Status: DC
  Administered 2019-06-10 (×2): via INTRAVENOUS

## 2019-06-10 MED ORDER — HYOSCYAMINE SULFATE 0.125 MG PO TBDP
0.2500 mg | ORAL_TABLET | ORAL | Status: DC
  Administered 2019-06-10: 0.25 mg via SUBLINGUAL
  Administered 2019-06-10: 0.125 mg via SUBLINGUAL
  Filled 2019-06-10 (×6): qty 2

## 2019-06-10 MED ORDER — ONDANSETRON HCL 4 MG/2ML IJ SOLN: 4.0000 mg | Freq: Four times a day (QID) | INTRAMUSCULAR | Status: DC | PRN

## 2019-06-10 MED ORDER — CEFAZOLIN SODIUM-DEXTROSE 2-4 GM/100ML-% IV SOLN
2.0000 g | Freq: Three times a day (TID) | INTRAVENOUS | Status: AC
  Administered 2019-06-10 (×2): 2 g via INTRAVENOUS
  Filled 2019-06-10 (×2): qty 100

## 2019-06-10 MED ORDER — SIMVASTATIN 20 MG PO TABS
40.0000 mg | ORAL_TABLET | Freq: Every day | ORAL | Status: DC
  Administered 2019-06-10 – 2019-06-13 (×4): 40 mg via ORAL
  Filled 2019-06-10 (×4): qty 2

## 2019-06-10 MED ORDER — SENNOSIDES-DOCUSATE SODIUM 8.6-50 MG PO TABS
1.0000 | ORAL_TABLET | Freq: Every day | ORAL | Status: DC
  Administered 2019-06-11 – 2019-06-12 (×2): 1 via ORAL
  Filled 2019-06-10 (×2): qty 1

## 2019-06-10 MED ORDER — HYOSCYAMINE SULFATE 0.125 MG PO TBDP
0.2500 mg | ORAL_TABLET | ORAL | Status: DC
  Filled 2019-06-10 (×2): qty 2

## 2019-06-10 MED ORDER — ACETAMINOPHEN 325 MG PO TABS: 325.0000 mg | ORAL_TABLET | Freq: Once | ORAL | Status: DC | PRN

## 2019-06-10 MED ORDER — SODIUM CHLORIDE (PF) 0.9 % IJ SOLN
INTRAMUSCULAR | Status: DC | PRN
  Administered 2019-06-10: 50 mL

## 2019-06-10 MED ORDER — PHENYLEPHRINE 40 MCG/ML (10ML) SYRINGE FOR IV PUSH (FOR BLOOD PRESSURE SUPPORT)
PREFILLED_SYRINGE | INTRAVENOUS | Status: AC
  Filled 2019-06-10: qty 10

## 2019-06-10 MED ORDER — SUGAMMADEX SODIUM 200 MG/2ML IV SOLN
INTRAVENOUS | Status: DC | PRN
  Administered 2019-06-10: 200 mg via INTRAVENOUS

## 2019-06-10 MED ORDER — ZINC 50 MG PO TABS: 50.0000 mg | ORAL_TABLET | Freq: Every day | ORAL | Status: DC

## 2019-06-10 MED ORDER — MIDAZOLAM HCL 2 MG/2ML IJ SOLN
INTRAMUSCULAR | Status: AC
  Filled 2019-06-10: qty 2

## 2019-06-10 MED ORDER — ONDANSETRON HCL 4 MG/2ML IJ SOLN
INTRAMUSCULAR | Status: AC
  Filled 2019-06-10: qty 2

## 2019-06-10 SURGICAL SUPPLY — 90 items
ADH SKN CLS APL DERMABOND .7 (GAUZE/BANDAGES/DRESSINGS)
APL SWBSTK 6 STRL LF DISP (MISCELLANEOUS) ×2
APPLICATOR COTTON TIP 6 STRL (MISCELLANEOUS) IMPLANT
APPLICATOR COTTON TIP 6IN STRL (MISCELLANEOUS) ×3
BLADE CLIPPER SURG (BLADE) ×3 IMPLANT
BLADE SURG 11 STRL SS (BLADE) IMPLANT
CANISTER SUCT 3000ML PPV (MISCELLANEOUS) ×6 IMPLANT
CATH KIT ON-Q SILVERSOAK 5 (CATHETERS) IMPLANT
CATH KIT ON-Q SILVERSOAK 5IN (CATHETERS) IMPLANT
CATH ROBINSON RED A/P 18FR (CATHETERS) ×1 IMPLANT
CATH THORACIC 28FR (CATHETERS) ×1 IMPLANT
CATH THORACIC 36FR (CATHETERS) IMPLANT
CATH THORACIC 36FR RT ANG (CATHETERS) IMPLANT
CLEANER TIP ELECTROSURG 2X2 (MISCELLANEOUS) ×1 IMPLANT
CLIP VESOCCLUDE MED 24/CT (CLIP) ×3 IMPLANT
CONN ST 1/4X3/8  BEN (MISCELLANEOUS) ×1
CONN ST 1/4X3/8 BEN (MISCELLANEOUS) IMPLANT
CONN Y 3/8X3/8X3/8  BEN (MISCELLANEOUS)
CONN Y 3/8X3/8X3/8 BEN (MISCELLANEOUS) IMPLANT
CONT SPEC 4OZ CLIKSEAL STRL BL (MISCELLANEOUS) ×8 IMPLANT
COVER SURGICAL LIGHT HANDLE (MISCELLANEOUS) ×6 IMPLANT
COVER WAND RF STERILE (DRAPES) ×3 IMPLANT
CUTTER ECHEON FLEX ENDO 45 340 (ENDOMECHANICALS) ×1 IMPLANT
DEFOGGER ANTIFOG KIT (MISCELLANEOUS) ×1 IMPLANT
DERMABOND ADVANCED (GAUZE/BANDAGES/DRESSINGS)
DERMABOND ADVANCED .7 DNX12 (GAUZE/BANDAGES/DRESSINGS) IMPLANT
DRAIN CHANNEL 28F RND 3/8 FF (WOUND CARE) ×1 IMPLANT
DRAPE LAPAROSCOPIC ABDOMINAL (DRAPES) ×3 IMPLANT
DRAPE WARM FLUID 44X44 (DRAPES) ×3 IMPLANT
ELECT BLADE 6.5 EXT (BLADE) ×3 IMPLANT
ELECT REM PT RETURN 9FT ADLT (ELECTROSURGICAL) ×3
ELECTRODE REM PT RTRN 9FT ADLT (ELECTROSURGICAL) ×2 IMPLANT
GAUZE SPONGE 4X4 12PLY STRL (GAUZE/BANDAGES/DRESSINGS) ×3 IMPLANT
GLOVE SURG SIGNA 7.5 PF LTX (GLOVE) ×6 IMPLANT
GLOVE TRIUMPH SURG SIZE 7.5 (KITS) ×1 IMPLANT
GOWN STRL REUS W/ TWL LRG LVL3 (GOWN DISPOSABLE) ×4 IMPLANT
GOWN STRL REUS W/ TWL XL LVL3 (GOWN DISPOSABLE) ×2 IMPLANT
GOWN STRL REUS W/TWL LRG LVL3 (GOWN DISPOSABLE) ×6
GOWN STRL REUS W/TWL XL LVL3 (GOWN DISPOSABLE) ×3
KIT BASIN OR (CUSTOM PROCEDURE TRAY) ×3 IMPLANT
KIT TURNOVER KIT B (KITS) ×3 IMPLANT
NDL SPNL 18GX3.5 QUINCKE PK (NEEDLE) IMPLANT
NEEDLE SPNL 18GX3.5 QUINCKE PK (NEEDLE) IMPLANT
NS IRRIG 1000ML POUR BTL (IV SOLUTION) ×9 IMPLANT
PACK CHEST (CUSTOM PROCEDURE TRAY) ×3 IMPLANT
PACK UNIVERSAL I (CUSTOM PROCEDURE TRAY) ×1 IMPLANT
PAD ARMBOARD 7.5X6 YLW CONV (MISCELLANEOUS) ×6 IMPLANT
RELOAD STAPLE 45 GOLD REG/THCK (STAPLE) IMPLANT
SCISSORS LAP 5X35 DISP (ENDOMECHANICALS) IMPLANT
SEALANT SURG COSEAL 4ML (VASCULAR PRODUCTS) IMPLANT
SEALANT SURG COSEAL 8ML (VASCULAR PRODUCTS) IMPLANT
SOL ANTI FOG 6CC (MISCELLANEOUS) ×2 IMPLANT
SOLUTION ANTI FOG 6CC (MISCELLANEOUS) ×1
SPECIMEN JAR MEDIUM (MISCELLANEOUS) ×3 IMPLANT
SPONGE TONSIL TAPE 1 RFD (DISPOSABLE) ×3 IMPLANT
STAPLE RELOAD 45MM GOLD (STAPLE) ×9 IMPLANT
SUT PROLENE 3 0 SH DA (SUTURE) IMPLANT
SUT PROLENE 4 0 RB 1 (SUTURE)
SUT PROLENE 4-0 RB1 .5 CRCL 36 (SUTURE) IMPLANT
SUT SILK  1 MH (SUTURE) ×4
SUT SILK 1 MH (SUTURE) ×4 IMPLANT
SUT SILK 1 TIES 10X30 (SUTURE) IMPLANT
SUT SILK 2 0 SH (SUTURE) IMPLANT
SUT SILK 2 0SH CR/8 30 (SUTURE) IMPLANT
SUT SILK 3 0 SH 30 (SUTURE) IMPLANT
SUT SILK 3 0 SH CR/8 (SUTURE) IMPLANT
SUT VIC AB 1 CTX 36 (SUTURE) ×6
SUT VIC AB 1 CTX36XBRD ANBCTR (SUTURE) ×4 IMPLANT
SUT VIC AB 2 TP1 27 (SUTURE) ×3 IMPLANT
SUT VIC AB 2-0 CT1 27 (SUTURE)
SUT VIC AB 2-0 CT1 TAPERPNT 27 (SUTURE) IMPLANT
SUT VIC AB 2-0 CTX 36 (SUTURE) ×6 IMPLANT
SUT VIC AB 2-0 UR6 27 (SUTURE) IMPLANT
SUT VIC AB 3-0 MH 27 (SUTURE) IMPLANT
SUT VIC AB 3-0 SH 27 (SUTURE) ×3
SUT VIC AB 3-0 SH 27X BRD (SUTURE) IMPLANT
SUT VIC AB 3-0 X1 27 (SUTURE) ×6 IMPLANT
SYR 10ML LL (SYRINGE) ×3 IMPLANT
SYR 20ML LL LF (SYRINGE) IMPLANT
SYSTEM SAHARA CHEST DRAIN ATS (WOUND CARE) ×3 IMPLANT
TAPE CLOTH SURG 6X10 WHT LF (GAUZE/BANDAGES/DRESSINGS) ×1 IMPLANT
TAPE UMBILICAL 1/8 X36 TWILL (MISCELLANEOUS) IMPLANT
TIP APPLICATOR SPRAY EXTEND 16 (VASCULAR PRODUCTS) IMPLANT
TOWEL GREEN STERILE (TOWEL DISPOSABLE) ×3 IMPLANT
TOWEL GREEN STERILE FF (TOWEL DISPOSABLE) ×3 IMPLANT
TRAP SPECIMEN MUCOUS 40CC (MISCELLANEOUS) ×1 IMPLANT
TRAY FOLEY MTR SLVR 16FR STAT (SET/KITS/TRAYS/PACK) ×3 IMPLANT
TROCAR XCEL BLADELESS 5X75MML (TROCAR) ×1 IMPLANT
TUBING EXTENTION W/L.L. (IV SETS) ×1 IMPLANT
WATER STERILE IRR 1000ML POUR (IV SOLUTION) ×3 IMPLANT

## 2019-06-10 NOTE — Anesthesia Procedure Notes (Signed)
Central Venous Catheter Insertion Performed by: Effie Berkshire, MD, anesthesiologist Start/End10/01/2019 8:55 AM, 06/10/2019 9:05 AM Patient location: Pre-op. Preanesthetic checklist: patient identified, IV checked, site marked, risks and benefits discussed, surgical consent, monitors and equipment checked, pre-op evaluation, timeout performed and anesthesia consent Position: Trendelenburg Lidocaine 1% used for infiltration and patient sedated Hand hygiene performed , maximum sterile barriers used  and Seldinger technique used Catheter size: 8 Fr Total catheter length 16. Central line was placed.Double lumen Procedure performed using ultrasound guided technique. Ultrasound Notes:anatomy identified, needle tip was noted to be adjacent to the nerve/plexus identified, no ultrasound evidence of intravascular and/or intraneural injection and image(s) printed for medical record Attempts: 1 Following insertion, dressing applied, line sutured and Biopatch. Post procedure assessment: blood return through all ports  Patient tolerated the procedure well with no immediate complications.

## 2019-06-10 NOTE — Transfer of Care (Signed)
Immediate Anesthesia Transfer of Care Note  Patient: NALY SCHWANZ  Procedure(s) Performed: Right video assited thoracoscopy, biopsy of right lower lobe with talc pleuradesis (Right Chest) Drainage Of Pleural Effusion (Right Chest) Node Dissection (Right Chest)  Patient Location: PACU  Anesthesia Type:General  Level of Consciousness: awake, patient cooperative and responds to stimulation  Airway & Oxygen Therapy: Patient Spontanous Breathing and Patient connected to face mask oxygen  Post-op Assessment: Report given to RN and Post -op Vital signs reviewed and stable  Post vital signs: Reviewed and stable  Last Vitals:  Vitals Value Taken Time  BP 137/84 06/10/19 1215  Temp    Pulse 87 06/10/19 1222  Resp 27 06/10/19 1222  SpO2 99 % 06/10/19 1222  Vitals shown include unvalidated device data.  Last Pain:  Vitals:   06/10/19 0746  PainSc: 0-No pain         Complications: No apparent anesthesia complications

## 2019-06-10 NOTE — Plan of Care (Signed)
  Problem: Clinical Measurements: Goal: Ability to maintain clinical measurements within normal limits will improve Outcome: Progressing Goal: Will remain free from infection Outcome: Progressing   

## 2019-06-10 NOTE — Anesthesia Postprocedure Evaluation (Signed)
Anesthesia Post Note  Patient: Cheyenne Hutchinson  Procedure(s) Performed: Right video assited thoracoscopy, biopsy of right lower lobe with talc pleuradesis (Right Chest) Drainage Of Pleural Effusion (Right Chest) Node Dissection (Right Chest)     Patient location during evaluation: PACU Anesthesia Type: General Level of consciousness: awake and alert Pain management: pain level controlled Vital Signs Assessment: post-procedure vital signs reviewed and stable Respiratory status: spontaneous breathing, nonlabored ventilation, respiratory function stable and patient connected to nasal cannula oxygen Cardiovascular status: blood pressure returned to baseline and stable Postop Assessment: no apparent nausea or vomiting Anesthetic complications: no    Last Vitals:  Vitals:   06/10/19 1505 06/10/19 1600  BP:  124/65  Pulse: 87 89  Resp: (!) 24 (!) 21  Temp:  36.7 C  SpO2:  99%    Last Pain:  Vitals:   06/10/19 1600  TempSrc: Oral  PainSc: [place]

## 2019-06-10 NOTE — Anesthesia Procedure Notes (Signed)
Procedure Name: Intubation Date/Time: 06/10/2019 10:05 AM Performed by: Verdie Drown, CRNA Pre-anesthesia Checklist: Patient identified, Emergency Drugs available, Suction available and Patient being monitored Patient Re-evaluated:Patient Re-evaluated prior to induction Oxygen Delivery Method: Circle System Utilized Preoxygenation: Pre-oxygenation with 100% oxygen Induction Type: IV induction Ventilation: Mask ventilation without difficulty Laryngoscope Size: Mac and 3 Grade View: Grade I Tube type: Oral Endobronchial tube: Bronchial Blocker placed under direct vision, EBT position confirmed by auscultation, EBT position confirmed by fiberoptic bronchoscope and Right Tube size: 7.0 mm Number of attempts: 3 Airway Equipment and Method: Stylet,  Oral airway and Video-laryngoscopy Placement Confirmation: ETT inserted through vocal cords under direct vision,  positive ETCO2 and breath sounds checked- equal and bilateral Secured at: 19 cm Tube secured with: Tape Dental Injury: Teeth and Oropharynx as per pre-operative assessment and Injury to lip  Difficulty Due To: Difficulty was anticipated, Difficult Airway- due to anterior larynx and Difficult Airway- due to limited oral opening Comments: DL with MAC3 for Grade1 view--unable to pass Vivasight 37EBT 2*anterior VC.  2nd attempt DL with MAC3 7.0ETT with bougie assist for EBT unsuccessful. 3rd attempt DL with Glidescope MAC3, 7.0ETT and Bronchial Blocker.  Positioned R)Lobe confirmed per Dr Smith Robert. R)upper lip noted with small abrasion, lubricant applied.

## 2019-06-10 NOTE — Progress Notes (Signed)
Chaplain engaged in initial visit with Cheyenne Hutchinson and her brother.  Chaplain provided spiritual presence and support.  Will continue to follow-up.

## 2019-06-10 NOTE — Interval H&P Note (Signed)
History and Physical Interval Note:  06/10/2019 8:53 AM  Cheyenne Hutchinson  has presented today for surgery, with the diagnosis of RLL ADENOCARCINOMA.  The various methods of treatment have been discussed with the patient and family. After consideration of risks, benefits and other options for treatment, the patient has consented to  Procedure(s): THORACOTOMY/RIGHT LOWER LOBECTOMY (Right) as a surgical intervention.  The patient's history has been reviewed, patient examined, no change in status, stable for surgery.  I have reviewed the patient's chart and labs.  Questions were answered to the patient's satisfaction.     Melrose Nakayama  Cytology showed reactive cells, no tumor seem

## 2019-06-10 NOTE — Plan of Care (Signed)
  Problem: Education: Goal: Knowledge of General Education information will improve Description: Including pain rating scale, medication(s)/side effects and non-pharmacologic comfort measures Outcome: Progressing   Problem: Health Behavior/Discharge Planning: Goal: Ability to manage health-related needs will improve Outcome: Progressing   Problem: Clinical Measurements: Goal: Ability to maintain clinical measurements within normal limits will improve Outcome: Progressing Goal: Will remain free from infection Outcome: Progressing Goal: Diagnostic test results will improve Outcome: Progressing Goal: Respiratory complications will improve Outcome: Progressing Goal: Cardiovascular complication will be avoided Outcome: Progressing   Problem: Activity: Goal: Risk for activity intolerance will decrease Outcome: Progressing   Problem: Nutrition: Goal: Adequate nutrition will be maintained Outcome: Progressing   Problem: Coping: Goal: Level of anxiety will decrease Outcome: Progressing   Problem: Elimination: Goal: Will not experience complications related to bowel motility Outcome: Progressing Goal: Will not experience complications related to urinary retention Outcome: Progressing   Problem: Safety: Goal: Ability to remain free from injury will improve Outcome: Progressing   Problem: Skin Integrity: Goal: Risk for impaired skin integrity will decrease Outcome: Progressing   Problem: Education: Goal: Knowledge of disease or condition will improve Outcome: Progressing Goal: Knowledge of the prescribed therapeutic regimen will improve Outcome: Progressing   Problem: Activity: Goal: Risk for activity intolerance will decrease Outcome: Progressing   Problem: Cardiac: Goal: Will achieve and/or maintain hemodynamic stability Outcome: Progressing   Problem: Clinical Measurements: Goal: Postoperative complications will be avoided or minimized Outcome: Progressing    Problem: Respiratory: Goal: Respiratory status will improve Outcome: Progressing   Problem: Pain Management: Goal: Pain level will decrease Outcome: Progressing   Problem: Skin Integrity: Goal: Wound healing without signs and symptoms infection will improve Outcome: Progressing

## 2019-06-10 NOTE — Anesthesia Procedure Notes (Signed)
Arterial Line Insertion Start/End10/01/2019 8:45 AM Performed by: Janace Litten, CRNA, CRNA  Preanesthetic checklist: patient identified, IV checked, risks and benefits discussed, surgical consent and monitors and equipment checked Lidocaine 1% used for infiltration Left, radial was placed Catheter size: 20 G Hand hygiene performed  and maximum sterile barriers used  Allen's test indicative of satisfactory collateral circulation Attempts: 2 Procedure performed without using ultrasound guided technique. Following insertion, dressing applied and Biopatch. Post procedure assessment: normal  Patient tolerated the procedure well with no immediate complications.

## 2019-06-10 NOTE — Op Note (Signed)
NAME: Cheyenne Hutchinson, COTO MEDICAL RECORD AT:[id number] ACCOUNT [id number] DATE OF BIRTH:1940/09/30 FACILITY: [facility] LOCATION: [facility] PHYSICIAN:Coleman Kalas C. Ladaisha Portillo, MD  OPERATIVE REPORT  DATE OF PROCEDURE:  06/10/2019  PREOPERATIVE DIAGNOSIS:  Adenocarcinoma, right lower lobe, suspected clinical stage IIIA (T4, N0).  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  PROCEDURE:   Right video-assisted thoracoscopy, Drainage of pleural effusion, Pleural biopsy, Wedge resection, Talc pleurodesis.  SURGEON:  Modesto Charon, MD  ASSISTANT:  Melodie Bouillon, MD.  ANESTHESIA:  General.  FINDINGS:  Left lower lobe was essentially completely replaced by tumor.  Pleural effusion present.  Biopsies of the parietal pleura showed carcinoma present.  CLINICAL NOTE:  The patient is a 78 year old nonsmoker who developed a persistent cough about 6 months ago.  Chest x-ray showed a possible right lower lobe pneumonia.  She was treated with antibiotics, but without improvement.  A CT of the chest showed  extensive consolidation of the right upper lobe.  Bronchoscopy showed a well-differentiated adenocarcinoma with lepidic spread.  On PET CT there was hypermetabolic activity in the lower lobe, but no significant hilar or mediastinal adenopathy.  She was  noted to have a small pleural effusion.  Thoracentesis was performed and cytology was negative for tumor cells.  The patient was offered right thoracotomy for right lower lobectomy.  The indications, risks, benefits, and alternatives were discussed in  detail with the patient.  She understood and accepted the risks and agreed to proceed.  OPERATIVE NOTE:  The patient was brought to the preoperative holding area on 06/10/2019.  The anesthesia service placed a central venous catheter and an arterial blood pressure monitoring line.  She was taken to the operating room, anesthetized and   intubated with a single lumen tube.  Anesthesia was unable to pass a double lumen tube.  A bronchial blocker was placed and inflated in the right main stem bronchus.  Intravenous antibiotics were administered.  Sequential compression devices were placed  on the calves for DVT prophylaxis.  A Foley catheter was placed.  She was placed in a left lateral decubitus position and the right chest was prepped and draped in the usual sterile fashion.  A timeout was performed.  An incision then was made in the eighth interspace in the anterior axillary line, a 5 mm port was placed into the chest, and the thoracoscope was advanced into the chest.  There was good isolation of the right lung, although the  right lower lobe did not deflate at all, the upper and middle lobes did deflate.  There was a moderate pleural effusion.  A 5 cm working incision was made posterolaterally.  The latissimus muscle was divided. The serratus muscle was spared and  retracted anteriorly.  The chest was entered.  The pleural effusion was evacuated and sent for cytology.  Inspection of the parietal pleura revealed abnormal appearance and a piece of the parietal pleura was taken as a biopsy and sent for frozen section.   While awaiting those results, the pleura was divided at the hilum anteriorly.  A small level 10 node was identified.  This was removed and sent for permanent pathology.  The pleura overlying the fissure between the middle and lower lobes was incised.   A relatively large level 12 node was identified.  It was dissected out and sent for permanent pathology as a separate specimen.  The fissure between the lower and middle lobes was completed with a single firing of  the Echelon stapler.  At this point, we  waited for the results of the frozen section.  No further dissection was performed.  The frozen section returned showing carcinoma in the parietal pleura consistent with stage IV disease.  The decision was made not to perform  a lobectomy as there was no  chance for an operative cure.  The chest was copiously irrigated with warm saline.  Decision was made to take a biopsy of the lower lobe to ensure adequate tissue for molecular testing.  This was done with an Echelon stapler, taking a biopsy along the inferior margin of the lobe.  The  decision was made to perform talc pleurodesis to hopefully prevent recurrence of the pleural effusion.  Four grams of talc was insufflated with good distribution.  A 28-French Blake drain was placed through the original port incision.  A second port  incision was made anterior to the first and a 28-French chest tube was placed through that.  Both tubes were secured to skin with #1 silk sutures.  Dual-lung ventilation was resumed.  The working incision was closed in standard fashion using #1 Vicryl fascial  sutures, 2-0 Vicryl subcutaneous suture and 3-0 Vicryl subcuticular suture.  Dermabond was applied.  The chest tubes were placed to suction.  The patient was extubated in the operating room and taken to the [facility] in good condition.  TN/NUANCE  D:06/10/2019 T:06/10/2019 JOB:[id number]

## 2019-06-10 NOTE — Brief Op Note (Signed)
06/10/2019  12:01 PM  PATIENT:  Cheyenne Hutchinson  78 y.o. female  PRE-OPERATIVE DIAGNOSIS:  RLL ADENOCARCINOMA- Clinical stage IIIA(T4,N0)  POST-OPERATIVE DIAGNOSIS:  RLL Adenocarcinoma - Pathologic stage IV(T4,N0,M1)  PROCEDURE:   Right VATS Drainage of pleural effusion Lung biopsy Lymph node sampling Talc pleurodesis  SURGEON:  Surgeon(s) and Role:    * Melrose Nakayama, MD - Primary    * Lightfoot, Lucile Crater, MD - Assisting  PHYSICIAN ASSISTANT:   ASSISTANTS: -   ANESTHESIA:   general  EBL:  100 mL   BLOOD ADMINISTERED:none  DRAINS: 2 Chest Tube(s) in the right pleural space   LOCAL MEDICATIONS USED:  BUPIVICAINE   SPECIMEN:  Source of Specimen:  Pleura, lymph nodes, pleural fluid, lung biopsy  DISPOSITION OF SPECIMEN:  PATHOLOGY  COUNTS:  YES  TOURNIQUET:  * No tourniquets in log *  DICTATION: .Other Dictation: Dictation Number -  PLAN OF CARE: Admit to inpatient   PATIENT DISPOSITION:  PACU - hemodynamically stable.   Delay start of Pharmacological VTE agent (>24hrs) due to surgical blood loss or risk of bleeding: no

## 2019-06-11 ENCOUNTER — Encounter (HOSPITAL_COMMUNITY): Payer: Self-pay | Admitting: Thoracic Surgery (Cardiothoracic Vascular Surgery)

## 2019-06-11 ENCOUNTER — Inpatient Hospital Stay (HOSPITAL_COMMUNITY): Payer: Medicare Other

## 2019-06-11 LAB — BASIC METABOLIC PANEL
Anion gap: 9 (ref 5–15)
BUN: 6 mg/dL — ABNORMAL LOW (ref 8–23)
CO2: 25 mmol/L (ref 22–32)
Calcium: 8.4 mg/dL — ABNORMAL LOW (ref 8.9–10.3)
Chloride: 105 mmol/L (ref 98–111)
Creatinine, Ser: 0.65 mg/dL (ref 0.44–1.00)
GFR calc Af Amer: >60 mL/min (ref >60)
GFR calc non Af Amer: >60 mL/min (ref >60)
Glucose, Bld: 138 mg/dL — ABNORMAL HIGH (ref 70–99)
Potassium: 3.6 mmol/L (ref 3.5–5.1)
Sodium: 139 mmol/L (ref 135–145)

## 2019-06-11 LAB — CBC
HCT: 32.3 % — ABNORMAL LOW (ref 36.0–46.0)
Hemoglobin: 10.2 g/dL — ABNORMAL LOW (ref 12.0–15.0)
MCH: 28.9 pg (ref 26.0–34.0)
MCHC: 31.6 g/dL (ref 30.0–36.0)
MCV: 91.5 fL (ref 80.0–100.0)
Platelets: 189 10*3/uL (ref 150–400)
RBC: 3.53 MIL/uL — ABNORMAL LOW (ref 3.87–5.11)
RDW: 14.6 % (ref 11.5–15.5)
WBC: 11.1 10*3/uL — ABNORMAL HIGH (ref 4.0–10.5)
nRBC: 0 % (ref 0.0–0.2)

## 2019-06-11 LAB — GLUCOSE, CAPILLARY
Glucose-Capillary: 131 mg/dL — ABNORMAL HIGH (ref 70–99)
Glucose-Capillary: 87 mg/dL (ref 70–99)
Glucose-Capillary: 146 mg/dL — ABNORMAL HIGH (ref 70–99)
Glucose-Capillary: 120 mg/dL — ABNORMAL HIGH (ref 70–99)

## 2019-06-11 MED ORDER — GLIMEPIRIDE 1 MG PO TABS
1.0000 mg | ORAL_TABLET | Freq: Every day | ORAL | Status: DC
  Administered 2019-06-11 – 2019-06-14 (×4): 1 mg via ORAL
  Filled 2019-06-11 (×4): qty 1

## 2019-06-11 MED ORDER — HYOSCYAMINE SULFATE 0.125 MG SL SUBL
0.2500 mg | SUBLINGUAL_TABLET | SUBLINGUAL | Status: DC
  Administered 2019-06-11 – 2019-06-12 (×6): 0.25 mg via SUBLINGUAL
  Filled 2019-06-11 (×23): qty 2

## 2019-06-11 MED ORDER — PIOGLITAZONE HCL 15 MG PO TABS
15.0000 mg | ORAL_TABLET | Freq: Every day | ORAL | Status: DC
  Administered 2019-06-11 – 2019-06-13 (×3): 15 mg via ORAL
  Filled 2019-06-11 (×4): qty 1

## 2019-06-11 MED ORDER — ALBUTEROL SULFATE (2.5 MG/3ML) 0.083% IN NEBU: 2.5000 mg | INHALATION_SOLUTION | Freq: Four times a day (QID) | RESPIRATORY_TRACT | Status: DC | PRN

## 2019-06-11 MED ORDER — METFORMIN HCL ER 500 MG PO TB24
500.0000 mg | ORAL_TABLET | Freq: Two times a day (BID) | ORAL | Status: DC
  Administered 2019-06-11 – 2019-06-14 (×7): 500 mg via ORAL
  Filled 2019-06-11 (×8): qty 1

## 2019-06-11 NOTE — Progress Notes (Signed)
1 Day Post-Op Procedure(s) (LRB): Right video assited thoracoscopy, biopsy of right lower lobe with talc pleuradesis (Right) Drainage Of Pleural Effusion (Right) Node Dissection (Right) Subjective: C/o pain when she coughs, denies nausea  Objective: Vital signs in last 24 hours: Temp:  [97.6 F (36.4 C)-98.6 F (37 C)] 98.5 F (36.9 C) (10/06 0342) Pulse Rate:  [73-93] 93 (10/06 0600) Cardiac Rhythm: Normal sinus rhythm (10/06 0750) Resp:  [17-31] 24 (10/06 0600) BP: (93-147)/(48-84) 108/60 (10/06 0600) SpO2:  [88 %-100 %] 91 % (10/06 0600) Arterial Line BP: (154-160)/(56-64) 160/60 (10/05 1248) Weight:  [87 kg] 87 kg (10/05 1338)  Hemodynamic parameters for last 24 hours:    Intake/Output from previous day: 10/05 0701 - 10/06 0700 In: 2496.1 [I.V.:2296.1; IV Piggyback:200] Out: 1558 [Urine:1150; Blood:100; Chest Tube:308] Intake/Output this shift: No intake/output data recorded.  General appearance: alert, cooperative and no distress Neurologic: intact Heart: regular rate and rhythm Lungs: diminished breath sounds bibasilar no air leak  Lab Results: Recent Labs    06/11/19 0500  WBC 11.1*  HGB 10.2*  HCT 32.3*  PLT 189   BMET:  Recent Labs    06/11/19 0500  NA 139  K 3.6  CL 105  CO2 25  GLUCOSE 138*  BUN 6*  CREATININE 0.65  CALCIUM 8.4*    PT/INR: No results for input(s): LABPROT, INR in the last 72 hours. ABG    Component Value Date/Time   PHART 7.463 (H) 06/06/2019 1525   HCO3 24.2 06/06/2019 1525   O2SAT 97.8 06/06/2019 1525   CBG (last 3)  Recent Labs    06/10/19 1632 06/10/19 2127 06/11/19 0617  GLUCAP 192* 253* 131*    Assessment/Plan: S/P Procedure(s) (LRB): Right video assited thoracoscopy, biopsy of right lower lobe with talc pleuradesis (Right) Drainage Of Pleural Effusion (Right) Node Dissection (Right) - Stage IV adenocarcinoma Keep CT to suction today Ambulate SCD + enoxaparin for DVT prophylaxis Advance diet Resume  home diabetes meds Pain well controlled with Tramadol   LOS: 1 day    Melrose Nakayama 06/11/2019

## 2019-06-12 ENCOUNTER — Inpatient Hospital Stay (HOSPITAL_COMMUNITY): Payer: Medicare Other

## 2019-06-12 LAB — GLUCOSE, CAPILLARY
Glucose-Capillary: 86 mg/dL (ref 70–99)
Glucose-Capillary: 122 mg/dL — ABNORMAL HIGH (ref 70–99)
Glucose-Capillary: 68 mg/dL — ABNORMAL LOW (ref 70–99)
Glucose-Capillary: 201 mg/dL — ABNORMAL HIGH (ref 70–99)

## 2019-06-12 LAB — COMPREHENSIVE METABOLIC PANEL
ALT: 15 U/L (ref 0–44)
AST: 24 U/L (ref 15–41)
Albumin: 2.3 g/dL — ABNORMAL LOW (ref 3.5–5.0)
Alkaline Phosphatase: 34 U/L — ABNORMAL LOW (ref 38–126)
Anion gap: 9 (ref 5–15)
BUN: 9 mg/dL (ref 8–23)
CO2: 25 mmol/L (ref 22–32)
Calcium: 8.2 mg/dL — ABNORMAL LOW (ref 8.9–10.3)
Chloride: 104 mmol/L (ref 98–111)
Creatinine, Ser: 0.73 mg/dL (ref 0.44–1.00)
GFR calc Af Amer: >60 mL/min (ref >60)
GFR calc non Af Amer: >60 mL/min (ref >60)
Glucose, Bld: 88 mg/dL (ref 70–99)
Potassium: 3.6 mmol/L (ref 3.5–5.1)
Sodium: 138 mmol/L (ref 135–145)
Total Bilirubin: 0.7 mg/dL (ref 0.3–1.2)
Total Protein: 5.5 g/dL — ABNORMAL LOW (ref 6.5–8.1)

## 2019-06-12 LAB — CBC
HCT: 31 % — ABNORMAL LOW (ref 36.0–46.0)
Hemoglobin: 9.7 g/dL — ABNORMAL LOW (ref 12.0–15.0)
MCH: 28.8 pg (ref 26.0–34.0)
MCHC: 31.3 g/dL (ref 30.0–36.0)
MCV: 92 fL (ref 80.0–100.0)
Platelets: 185 10*3/uL (ref 150–400)
RBC: 3.37 MIL/uL — ABNORMAL LOW (ref 3.87–5.11)
RDW: 14.8 % (ref 11.5–15.5)
WBC: 9.9 10*3/uL (ref 4.0–10.5)
nRBC: 0 % (ref 0.0–0.2)

## 2019-06-12 NOTE — Progress Notes (Signed)
Pt self administers her CPAP

## 2019-06-12 NOTE — Progress Notes (Signed)
Chaplain provided follow-up visit to Cheyenne Hutchinson's brother who was in the hallway awaiting for a procedure to be completed on Cheyenne Hutchinson.  He thanked Clinical biochemist for checking on his sister.  Chaplain will continue to follow-up.

## 2019-06-12 NOTE — Discharge Instructions (Signed)
Discharge Instructions:  1. You may shower, please wash incisions daily with soap and water and keep dry.  If you wish to cover wounds with dressing you may do so but please keep clean and change daily.  No tub baths or swimming until incisions have completely healed.  If your incisions become red or develop any drainage please call our office at [phone]  2. No Driving until cleared by Dr. Leonarda Salon office and you are no longer using narcotic pain medications  3. Monitor your weight daily.. Please use the same scale and weigh at same time... If you gain 3-5 lbs in 48 hours with associated lower extremity swelling, please contact our office at [phone]  4. Fever of 101.5 for at least 24 hours, please contact our office at [phone]  5. Activity- up as tolerated, please walk at least 3 times per day.  Avoid strenuous activity, no lifting, pushing, or pulling with your arms over 8-10 lbs for a minimum of 6 weeks  6. If any questions or concerns arise, please do not hesitate to contact our office at [phone]

## 2019-06-12 NOTE — Progress Notes (Signed)
SATURATION QUALIFICATIONS: (This note is used to comply with regulatory documentation for home oxygen)  Patient Saturations on Room Air at Rest = 92%  Patient Saturations on Room Air while Ambulating = 84%  Patient Saturations on 2 Liters of oxygen while Ambulating = 92%  Please briefly explain why patient needs home oxygen: desats while ambulating.

## 2019-06-12 NOTE — Progress Notes (Signed)
2 Days Post-Op Procedure(s) (LRB): Right video assited thoracoscopy, biopsy of right lower lobe with talc pleuradesis (Right) Drainage Of Pleural Effusion (Right) Node Dissection (Right) Subjective: Some incisional pain, using tramadol sparingly, doesn't like pain meds  Objective: Vital signs in last 24 hours: Temp:  [97.7 F (36.5 C)-99 F (37.2 C)] 98.1 F (36.7 C) (10/07 0754) Pulse Rate:  [80-92] 80 (10/07 0500) Cardiac Rhythm: Normal sinus rhythm (10/07 0700) Resp:  [18-34] 19 (10/07 0754) BP: (103-127)/(55-71) 127/64 (10/07 0754) SpO2:  [90 %-98 %] 96 % (10/07 0500)  Hemodynamic parameters for last 24 hours:    Intake/Output from previous day: 10/06 0701 - 10/07 0700 In: 1688.1 [P.O.:1180; I.V.:508.1] Out: 1385 [Urine:1175; Chest Tube:210] Intake/Output this shift: No intake/output data recorded.  General appearance: alert, cooperative and no distress Neurologic: intact Heart: regular rate and rhythm Lungs: diminished breath sounds right base no air leak, serosanguinous output from CT  Lab Results: Recent Labs    06/11/19 0500 06/12/19 0520  WBC 11.1* 9.9  HGB 10.2* 9.7*  HCT 32.3* 31.0*  PLT 189 185   BMET:  Recent Labs    06/11/19 0500 06/12/19 0520  NA 139 138  K 3.6 3.6  CL 105 104  CO2 25 25  GLUCOSE 138* 88  BUN 6* 9  CREATININE 0.65 0.73  CALCIUM 8.4* 8.2*    PT/INR: No results for input(s): LABPROT, INR in the last 72 hours. ABG    Component Value Date/Time   PHART 7.463 (H) 06/06/2019 1525   HCO3 24.2 06/06/2019 1525   O2SAT 97.8 06/06/2019 1525   CBG (last 3)  Recent Labs    06/11/19 1603 06/11/19 2109 06/12/19 0609  GLUCAP 146* 120* 86    Assessment/Plan: S/P Procedure(s) (LRB): Right video assited thoracoscopy, biopsy of right lower lobe with talc pleuradesis (Right) Drainage Of Pleural Effusion (Right) Node Dissection (Right) Drainage from CT trending down and no air leak- will dc anterior tube and place posterior  tube to water seal Dc central line Foley dc'ed Continue ambulation SCD + enoxaparin for DVT prophylaxis CBG better controlled with addition of PO meds Anemia secondary to ABL, mild, follow   LOS: 2 days    Melrose Nakayama 06/12/2019

## 2019-06-12 NOTE — Discharge Summary (Addendum)
[address]       [place],Chey[place] [address]             [phone]      Physician Discharge Summary  Patient ID: Cheyenne Hutchinson MRN: [id number] DOB/AGE: 1941/07/07 78 y.o.  Admit date: 06/10/2019 Discharge date: 06/14/2019  Admission Diagnoses: Adenocarcinoma of the lung- clinical stage IIIA(T4,N0)  Patient Active Problem List   Diagnosis Date Noted  . Adenocarcinoma of lung, stage 4, right ([place]) 06/10/2019  . Chronic cough   . Simple chronic bronchitis ([place])   . Moderate persistent asthma without complication   . Consolidation of right lower lobe of lung ([place])   . Primary adenocarcinoma of lower lobe of right lung ([place])   . DM (diabetes mellitus) ([place])   . 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  She was in her usual state of health until the past winter.  In February she developed a persistent cough.  She was treated with antibiotics without improvement.  She had a chest x-ray which showed a probable right lower lobe pneumonia.  She was treated with Zithromax, but still did not improve.  She was referred to pulmonology, Dr Cheyenne Hutchinson.  A trial of Symbicort and Singulair was not helpful.  A repeat chest x-ray showed persistent consolidation.  A CT of the chest was done which showed extensive consolidation in the right lower lobe.  Bronchoscopy was done on 05/08/2019.  One fragment of her transbronchial biopsy showed well differentiated adenocarcinoma with lepidic growth.  A PET CT showed that area was hypermetabolic.  There was no significant hilar or mediastinal adenopathy.  She continues to have a persistent cough.  She says she will get short of  breath when she walks up a flight of stairs.  She can complete the flight but then takes a second to catch her breath.  Pulmonary function testing by Dr. Albertine Hutchinson showed an FEV1 of 111% of predicted.  Her DLCO was 52% of predicted.  She is not having any chest pain, pressure, or tightness.  She has no history of cardiac issues.  He has lost 5 pounds over the past 3 months.    Hospital Course:   On 06/10/2019 [name] underwent a video-assisted thoracoscopic surgery with drainage of the pleural effusion, pleural biopsy, wedge resection, and talc pleurodesis with Dr. Roxan Hutchinson and Dr. Kipp Hutchinson assisting.  She tolerated the seizure well was transferred to the stepdown unit for continued care.  She was extubated in a timely manner.  Postop day 1 she was slowly being weaned from supplemental oxygen.  We kept her chest tube in today due to drainage.  We resumed her home diabetic medications.  She had good pain control with tramadol alone.  She was found to have stage IV adenocarcinoma.  The official pathology report is still pending.  Postop day 2 we discontinued her central line and Foley.  We discontinued her anterior chest tube and placed a posterior chest tube to waterseal.  We continued Lovenox for DVT prophylaxis.  Her blood glucose level was better controlled with the addition of p.o. medications.  Her posterior chest tube was removed on 06/13/2019. Her follow-up chest xray was stable.  Today, she is tolerating room air, her incision is healing well, she is ambulating with limited assistance, and she is ready for discharge home. Since her chest tube was removed yesterday, we will wait for the follow-up visit to remove chest tube sutures.   Consults: None  Significant Diagnostic Studies:   CLINICAL DATA:  Chest tube, pleural effusion  EXAM: PORTABLE CHEST 1 VIEW  COMPARISON:  06/11/2019  FINDINGS: No significant change in a moderate right-sided pleural effusion with right-sided chest tubes  in position. There is a less than 5% right pneumothorax, new compared to prior examination. No new airspace opacity. Cardiomegaly. Right neck vascular catheter  IMPRESSION: No significant change in a moderate right-sided pleural effusion with right-sided chest tubes in position. There is a less than 5% right pneumothorax, new compared to prior examination.   Electronically Signed   By: Eddie Candle M.D.   On: 06/12/2019 10:08   Treatments:   NAME: Cheyenne Hutchinson MEDICAL RECORD [id number] ACCOUNT [id number] DATE OF BIRTH:03/21/1941 FACILITY: [facility] LOCATION: [facility] PHYSICIAN: C. , MD  OPERATIVE REPORT  DATE OF PROCEDURE:  06/10/2019  PREOPERATIVE DIAGNOSIS:  Adenocarcinoma, right lower lobe, suspected clinical stage IIIA (T4, N0).  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  PROCEDURE:  Right video-assisted thoracoscopy, drainage of pleural effusion, pleural biopsy, wedge resection, talc pleurodesis.  SURGEON:  Cheyenne Charon, MD  ASSISTANT:  Cheyenne Hutchinson.  ANESTHESIA:  General.  FINDINGS:  Left lower lobe was essentially completely replaced by tumor.  Pleural effusion present.  Biopsies of the parietal pleura showed carcinoma present.  Discharge Exam: Blood pressure 138/67, pulse 97, temperature 98.4 F (36.9 C), temperature source Oral, resp. rate 19, height 5\' 1"  (1.549 m), weight 87 kg, SpO2 95 %.   General appearance: alert, cooperative and no distress Heart: regular rate and rhythm, S1, S2 normal, no murmur, click, rub or gallop Lungs: clear to auscultation bilaterally Abdomen: soft, non-tender; bowel sounds normal; no masses,  no organomegaly Extremities: extremities normal, atraumatic, no cyanosis or edema Wound: clean and dry  Disposition: Discharge disposition: 01-Home or Self Care        Allergies as of 06/14/2019      Reactions   Aspirin  Itching, Swelling   Tetanus Toxoids Swelling   Adhesive [tape]    Irritation /rash on skin       Medication List    STOP taking these medications   COD LIVER OIL PO   losartan 50 MG tablet Commonly known as: COZAAR   NON FORMULARY     TAKE these medications   acetaminophen 500 MG tablet Commonly known as: TYLENOL Take 2 tablets (1,000 mg total) by mouth every 6 (six) hours.   Amaryl 1 MG tablet Generic drug: glimepiride Take 1 mg by mouth daily with breakfast. OR WITH THE FIRST MAIN MEAL OF THE DAY   benzonatate 200 MG capsule Commonly known as: TESSALON Take 200 mg by mouth 3 (three) times daily as needed for cough.   ciclopirox 8 % solution Commonly known as: PENLAC Apply topically at bedtime. Apply over nail and surrounding skin. Apply daily over previous coat. After seven (7) days, may remove with alcohol and continue cycle.   CITRACAL PO Take 4 tablets by mouth daily. PETITES   guaiFENesin-codeine 100-10 MG/5ML syrup Commonly known as: ROBITUSSIN AC Take 5 mLs by mouth 3 (three) times daily as needed for cough.   hyoscyamine 0.125 MG Tbdp disintergrating tablet Commonly known  as: ANASPAZ Place 0.25 mg under the tongue every 4 (four) hours.   LORazepam 0.5 MG tablet Commonly known as: ATIVAN Take 0.5 mg by mouth 2 (two) times daily as needed for anxiety.   Magnesium 400 MG Caps Take 2 capsules by mouth daily.   metFORMIN 500 MG 24 hr tablet Commonly known as: GLUCOPHAGE-XR Take 500 mg by mouth 2 (two) times daily.   multivitamin tablet Take 1 tablet by mouth daily. WOMEN'S ONE A DAY   pioglitazone 15 MG tablet Commonly known as: ACTOS Take 15 mg by mouth daily.   simvastatin 40 MG tablet Commonly known as: ZOCOR Take 40 mg by mouth daily at 6 PM.   traMADol 50 MG tablet Commonly known as: ULTRAM Take 1-2 tablets (50-100 mg total) by mouth every 6 (six) hours as needed (mild pain).   vitamin E 400 UNIT capsule Take 400 Units by mouth daily.    Zinc 50 MG Tabs Take 50 mg by mouth daily.            Durable Medical Equipment  (From admission, onward)         Start     Ordered   06/13/19 1239  For home use only DME Walker  Once    Question:  Patient needs a walker to treat with the following condition  Answer:  Weakness   06/13/19 1238   06/13/19 1239  For home use only DME Bedside commode  Once    Question:  Patient needs a bedside commode to treat with the following condition  Answer:  Weakness   06/13/19 1238         Follow-up Information    Birdie Sons, MD. Call in 1 day(s).   Specialty: Family Medicine Contact information: [address] [place] [address] [phone]        Melrose Nakayama, MD Follow up.   Specialty: Cardiothoracic Surgery Why: Your follow-up appointment is on  07/02/2019 at 11:45am. Please arrive at 11:15am for a chest xray located at [facility] which is on the first floor of our building.  Contact information: [address] [place] [address] [phone]        [facility] Follow up.   Why: rolling walker, bsc         Signed: Elgie Collard 06/14/2019, 8:38 AM

## 2019-06-12 NOTE — Care Management Important Message (Signed)
Important Message  Patient Details  Name: Cheyenne Hutchinson MRN: [id number] Date of Birth: 11-14-1940   Medicare Important Message Given:  Yes     Memory Argue 06/12/2019, 1:08 PM

## 2019-06-13 ENCOUNTER — Encounter: Payer: Self-pay | Admitting: Thoracic Surgery (Cardiothoracic Vascular Surgery)

## 2019-06-13 ENCOUNTER — Inpatient Hospital Stay (HOSPITAL_COMMUNITY): Payer: Medicare Other

## 2019-06-13 LAB — GLUCOSE, CAPILLARY
Glucose-Capillary: 95 mg/dL (ref 70–99)
Glucose-Capillary: 95 mg/dL (ref 70–99)
Glucose-Capillary: 89 mg/dL (ref 70–99)

## 2019-06-13 NOTE — Care Management Important Message (Signed)
Important Message  Patient Details  Name: Cheyenne Hutchinson MRN: [id number] Date of Birth: 1941-04-10   Medicare Important Message Given:  Yes     Shelda Altes 06/13/2019, 4:26 PM

## 2019-06-13 NOTE — TOC Transition Note (Signed)
Transition of Care [facility]) - CM/SW Discharge Note   Patient Details  Name: Cheyenne Hutchinson MRN: [id number] Date of Birth: 15-Aug-1941  [facility]) CM/SW Contact:  Zenon Mayo, RN Phone Number: 06/13/2019, 3:24 PM   Clinical Narrative:    Patient for dc tomorrow, will need BSC and rolling walker, referral given to [place] with [employer], ok with patient, will bring to patient room.   Final next level of care: Home/Self Care Barriers to Discharge: No Barriers Identified   Patient Goals and CMS Choice Patient states their goals for this hospitalization and ongoing recovery are:: get better CMS Medicare.gov Compare Post Acute Care list provided to:: Patient Choice offered to / list presented to : Patient  Discharge Placement                       Discharge Plan and Services                DME Arranged: Bedside commode, Walker rolling DME Agency: [employer] Date DME Agency Contacted: 06/13/19 Time DME Agency Contacted: [phone] Representative spoke with at DME Agency: zack HH Arranged: NA          Social Determinants of Health ([place]) Interventions     Readmission Risk Interventions No flowsheet data found.

## 2019-06-13 NOTE — Progress Notes (Addendum)
      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 She is using her incentive spirometer. Asking when she will be able to go home.   Objective: Vital signs in last 24 hours: Temp:  [97.7 F (36.5 C)-99 F (37.2 C)] 98.5 F (36.9 C) (10/08 0400) Pulse Rate:  [83-95] 91 (10/08 0400) Cardiac Rhythm: Normal sinus rhythm (10/08 0700) Resp:  [17-29] 23 (10/08 0400) BP: (107-123)/(54-71) 123/71 (10/08 0400) SpO2:  [97 %-98 %] 97 % (10/08 0400)     Intake/Output from previous day: 10/07 0701 - 10/08 0700 In: 0  Out: 580 [Urine:500; Chest Tube:80] Intake/Output this shift: No intake/output data recorded.  General appearance: alert, cooperative and no distress Heart: regular rate and rhythm, S1, S2 normal, no murmur, click, rub or gallop Lungs: clear to auscultation bilaterally Abdomen: soft, non-tender; bowel sounds normal; no masses,  no organomegaly Extremities: extremities normal, atraumatic, no cyanosis or edema Wound: clean and dry  Lab Results: Recent Labs    06/11/19 0500 06/12/19 0520  WBC 11.1* 9.9  HGB 10.2* 9.7*  HCT 32.3* 31.0*  PLT 189 185   BMET:  Recent Labs    06/11/19 0500 06/12/19 0520  NA 139 138  K 3.6 3.6  CL 105 104  CO2 25 25  GLUCOSE 138* 88  BUN 6* 9  CREATININE 0.65 0.73  CALCIUM 8.4* 8.2*    PT/INR: No results for input(s): LABPROT, INR in the last 72 hours. ABG    Component Value Date/Time   PHART 7.463 (H) 06/06/2019 1525   HCO3 24.2 06/06/2019 1525   O2SAT 97.8 06/06/2019 1525   CBG (last 3)  Recent Labs    06/12/19 1602 06/12/19 2054 06/13/19 0629  GLUCAP 68* 201* 95    Assessment/Plan: S/P Procedure(s) (LRB): Right video assited thoracoscopy, biopsy of right  lower lobe with talc pleuradesis (Right) Drainage Of Pleural Effusion (Right) Node Dissection (Right)  1. Chest tube- on water seal without an air leak. CXR this morning is stable. Will remove remaining chest tube. 2. Pulm-continue to wean oxygen as tolerated.  3. Renal- creatinine 0.73, electrolytes okay 4. H and H 9.7/31.0, expected acute blood loss anemia 5. Endo-blood glucose well controlled, on home diabetes medications.  Plan: Working on ambulating. Continue to use incentive spirometer hourly. Discontinue remaining chest tube with CXR in the morning. Probably home this weekend.    LOS: 3 days    Elgie Collard 06/13/2019 Patient seen and examined, d/w Ms. Harriet Pho, plan as above  Revonda Standard. Roxan Hockey, MD [facility] [phone]

## 2019-06-13 NOTE — Progress Notes (Signed)
Inpatient Diabetes Program Recommendations  AACE/ADA: New Consensus Statement on Inpatient Glycemic Control   Target Ranges:  Prepandial:   less than 140 mg/dL      Peak postprandial:   less than 180 mg/dL (1-2 hours)      Critically ill patients:  140 - 180 mg/dL   Results for JURI, DINNING (MRN [id number]) as of 06/13/2019 10:53  Ref. Range 06/12/2019 06:09 06/12/2019 11:16 06/12/2019 16:02 06/12/2019 20:54 06/13/2019 06:29  Glucose-Capillary Latest Ref Range: 70 - 99 mg/dL 86 122 (H) 68 (L) 201 (H) 95   Review of Glycemic Control  Diabetes history: DM2 Outpatient Diabetes medications: Amaryl 1 mg daily, Actos 15 mg daily, Metformin 500 mg BID Current orders for Inpatient glycemic control: Actos 15 mg daily, Amaryl 1 mg QAM, Metformin 500 mg BID, Novolog 0-24 units ACHS  Inpatient Diabetes Program Recommendations:    Insulin-Correction: Please consider decreasing Novolog correction to 0-9 units TID with meals and Novolog 0-5 units QHS.  Thanks, Barnie Alderman, RN, MSN, CDE Diabetes Coordinator Inpatient Diabetes Program [phone] (Team Pager from 8am to 5pm)

## 2019-06-13 NOTE — Plan of Care (Signed)
  Problem: Education: Goal: Knowledge of General Education information will improve Description: Including pain rating scale, medication(s)/side effects and non-pharmacologic comfort measures Outcome: Progressing   Problem: Clinical Measurements: Goal: Will remain free from infection Outcome: Progressing   Problem: Elimination: Goal: Will not experience complications related to bowel motility Outcome: Progressing Goal: Will not experience complications related to urinary retention Outcome: Progressing   Problem: Safety: Goal: Ability to remain free from injury will improve Outcome: Progressing   Problem: Skin Integrity: Goal: Risk for impaired skin integrity will decrease Outcome: Progressing

## 2019-06-14 ENCOUNTER — Inpatient Hospital Stay (HOSPITAL_COMMUNITY): Payer: Medicare Other

## 2019-06-14 LAB — GLUCOSE, CAPILLARY
Glucose-Capillary: 148 mg/dL — ABNORMAL HIGH (ref 70–99)
Glucose-Capillary: 112 mg/dL — ABNORMAL HIGH (ref 70–99)

## 2019-06-14 MED ORDER — ACETAMINOPHEN 500 MG PO TABS: 1000.0000 mg | ORAL_TABLET | Freq: Four times a day (QID) | ORAL | 0 refills | Status: AC

## 2019-06-14 MED ORDER — TRAMADOL HCL 50 MG PO TABS: 50.0000 mg | ORAL_TABLET | Freq: Four times a day (QID) | ORAL | 0 refills | Status: AC | PRN

## 2019-06-14 NOTE — Progress Notes (Addendum)
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 She is ready for discharge home.   Objective: Vital signs in last 24 hours: Temp:  [98 F (36.7 C)-99.1 F (37.3 C)] 98.4 F (36.9 C) (10/09 0300) Pulse Rate:  [82-98] 97 (10/09 0755) Cardiac Rhythm: Normal sinus rhythm (10/09 0701) Resp:  [17-25] 19 (10/09 0755) BP: (127-140)/(49-95) 138/67 (10/09 0755) SpO2:  [93 %-97 %] 95 % (10/09 0755)      Intake/Output from previous day: 10/08 0701 - 10/09 0700 In: 240 [P.O.:240] Out: 250 [Urine:250] Intake/Output this shift: No intake/output data recorded.  General appearance: alert, cooperative and no distress Heart: regular rate and rhythm, S1, S2 normal, no murmur, click, rub or gallop Lungs: clear to auscultation bilaterally Abdomen: soft, non-tender; bowel sounds normal; no masses,  no organomegaly Extremities: extremities normal, atraumatic, no cyanosis or edema Wound: clean and dry  Lab Results: Recent Labs    06/12/19 0520  WBC 9.9  HGB 9.7*  HCT 31.0*  PLT 185   BMET:  Recent Labs    06/12/19 0520  NA 138  K 3.6  CL 104  CO2 25  GLUCOSE 88  BUN 9  CREATININE 0.73  CALCIUM 8.2*    PT/INR: No results for input(s): LABPROT, INR in the last 72 hours. 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 Chest tube- removed  yesterday. CXR this morning is stable.  2. Pulm-tolerating room air with good oxygen saturation 3. Renal- creatinine 0.73, electrolytes okay 4. H and H 9.7/31.0, expected acute blood loss anemia 5. Endo-blood glucose well controlled, on home diabetes medications.   Plan: Home today if okay with Dr. Roxan Hockey. Her pain is well controlled on Tylenol.    LOS: 4 days    Elgie Collard 06/14/2019  Patient seen and examined, plan as above CXR stable- home today F/u with Dr. Glendell Docker One and PDL-1 testing pending  Revonda Standard. Roxan Hockey, MD [facility] [phone]

## 2019-06-14 NOTE — Plan of Care (Signed)
  Problem: Education: Goal: Knowledge of General Education information will improve Description: Including pain rating scale, medication(s)/side effects and non-pharmacologic comfort measures Outcome: Adequate for Discharge   Problem: Health Behavior/Discharge Planning: Goal: Ability to manage health-related needs will improve Outcome: Adequate for Discharge   Problem: Clinical Measurements: Goal: Ability to maintain clinical measurements within normal limits will improve Outcome: Adequate for Discharge Goal: Will remain free from infection Outcome: Adequate for Discharge Goal: Diagnostic test results will improve Outcome: Adequate for Discharge Goal: Respiratory complications will improve Outcome: Adequate for Discharge Goal: Cardiovascular complication will be avoided Outcome: Adequate for Discharge   Problem: Activity: Goal: Risk for activity intolerance will decrease Outcome: Adequate for Discharge   Problem: Nutrition: Goal: Adequate nutrition will be maintained Outcome: Adequate for Discharge   Problem: Coping: Goal: Level of anxiety will decrease Outcome: Adequate for Discharge   Problem: Elimination: Goal: Will not experience complications related to bowel motility Outcome: Adequate for Discharge Goal: Will not experience complications related to urinary retention Outcome: Adequate for Discharge   Problem: Pain Managment: Goal: General experience of comfort will improve Outcome: Adequate for Discharge   Problem: Safety: Goal: Ability to remain free from injury will improve Outcome: Adequate for Discharge   Problem: Skin Integrity: Goal: Risk for impaired skin integrity will decrease Outcome: Adequate for Discharge   Problem: Education: Goal: Knowledge of disease or condition will improve Outcome: Adequate for Discharge Goal: Knowledge of the prescribed therapeutic regimen will improve Outcome: Adequate for Discharge   Problem: Activity: Goal: Risk for  activity intolerance will decrease Outcome: Adequate for Discharge   Problem: Cardiac: Goal: Will achieve and/or maintain hemodynamic stability Outcome: Adequate for Discharge   Problem: Clinical Measurements: Goal: Postoperative complications will be avoided or minimized Outcome: Adequate for Discharge   Problem: Respiratory: Goal: Respiratory status will improve Outcome: Adequate for Discharge   Problem: Pain Management: Goal: Pain level will decrease Outcome: Adequate for Discharge   Problem: Skin Integrity: Goal: Wound healing without signs and symptoms infection will improve Outcome: Adequate for Discharge  DC instructions given to patient and brother at bedside. VSS. Educated on medication regimen, S/S for site care, driving and submerging in water restrictions. EScripts sent to [employer]. Pt aware of all follow up appointments. All belongings sent home with patient including home CPAP, walker and BSC. VATS and CT sites OTA, sutures intact. IJ CVC site intact, OTA. PIV DC, hemostasis achieved.   Pt escorted by NT via wheelchair to private vehicle driven by brother.

## 2019-06-17 ENCOUNTER — Other Ambulatory Visit: Payer: Self-pay

## 2019-06-18 LAB — SURGICAL PATHOLOGY

## 2019-06-18 LAB — CYTOLOGY - NON PAP

## 2019-06-21 ENCOUNTER — Encounter: Payer: Self-pay | Admitting: *Deleted

## 2019-06-27 ENCOUNTER — Encounter (HOSPITAL_COMMUNITY): Payer: Self-pay | Admitting: Thoracic Surgery (Cardiothoracic Vascular Surgery)

## 2019-07-02 ENCOUNTER — Ambulatory Visit: Payer: Medicare Other | Admitting: Thoracic Surgery (Cardiothoracic Vascular Surgery)

## 2019-07-07 NOTE — Progress Notes (Signed)
Oncology Nurse Navigator Documentation  Oncology Nurse Navigator Flowsheets 2019/07/02  Navigator Location [facility]  Navigator Encounter Type Other/I received an message from pathology dept. They are requesting stage and icd 10 code for patient to send molecular and PDL 1 testing, I provided.   Barriers/Navigation Needs Coordination of Care  Interventions Coordination of Care  Coordination of Care Other  Acuity Level 2-Minimal Needs (1-2 Barriers Identified)  Time Spent with Patient 30

## 2019-07-07 DEATH — deceased

## 2019-07-09 ENCOUNTER — Ambulatory Visit: Payer: Medicare Other | Admitting: Thoracic Surgery (Cardiothoracic Vascular Surgery)

## 2019-07-15 ENCOUNTER — Encounter (HOSPITAL_COMMUNITY): Payer: Self-pay | Admitting: Thoracic Surgery (Cardiothoracic Vascular Surgery)

## 2020-09-23 IMAGING — CR DG CHEST 2V
2 series · 2 of 2 positions shown · non-contrast
Comparison: None.

CLINICAL DATA: Preop for right lower lobe surgery due to cancer

EXAM:
CHEST - 2 VIEW

[w chest pa]
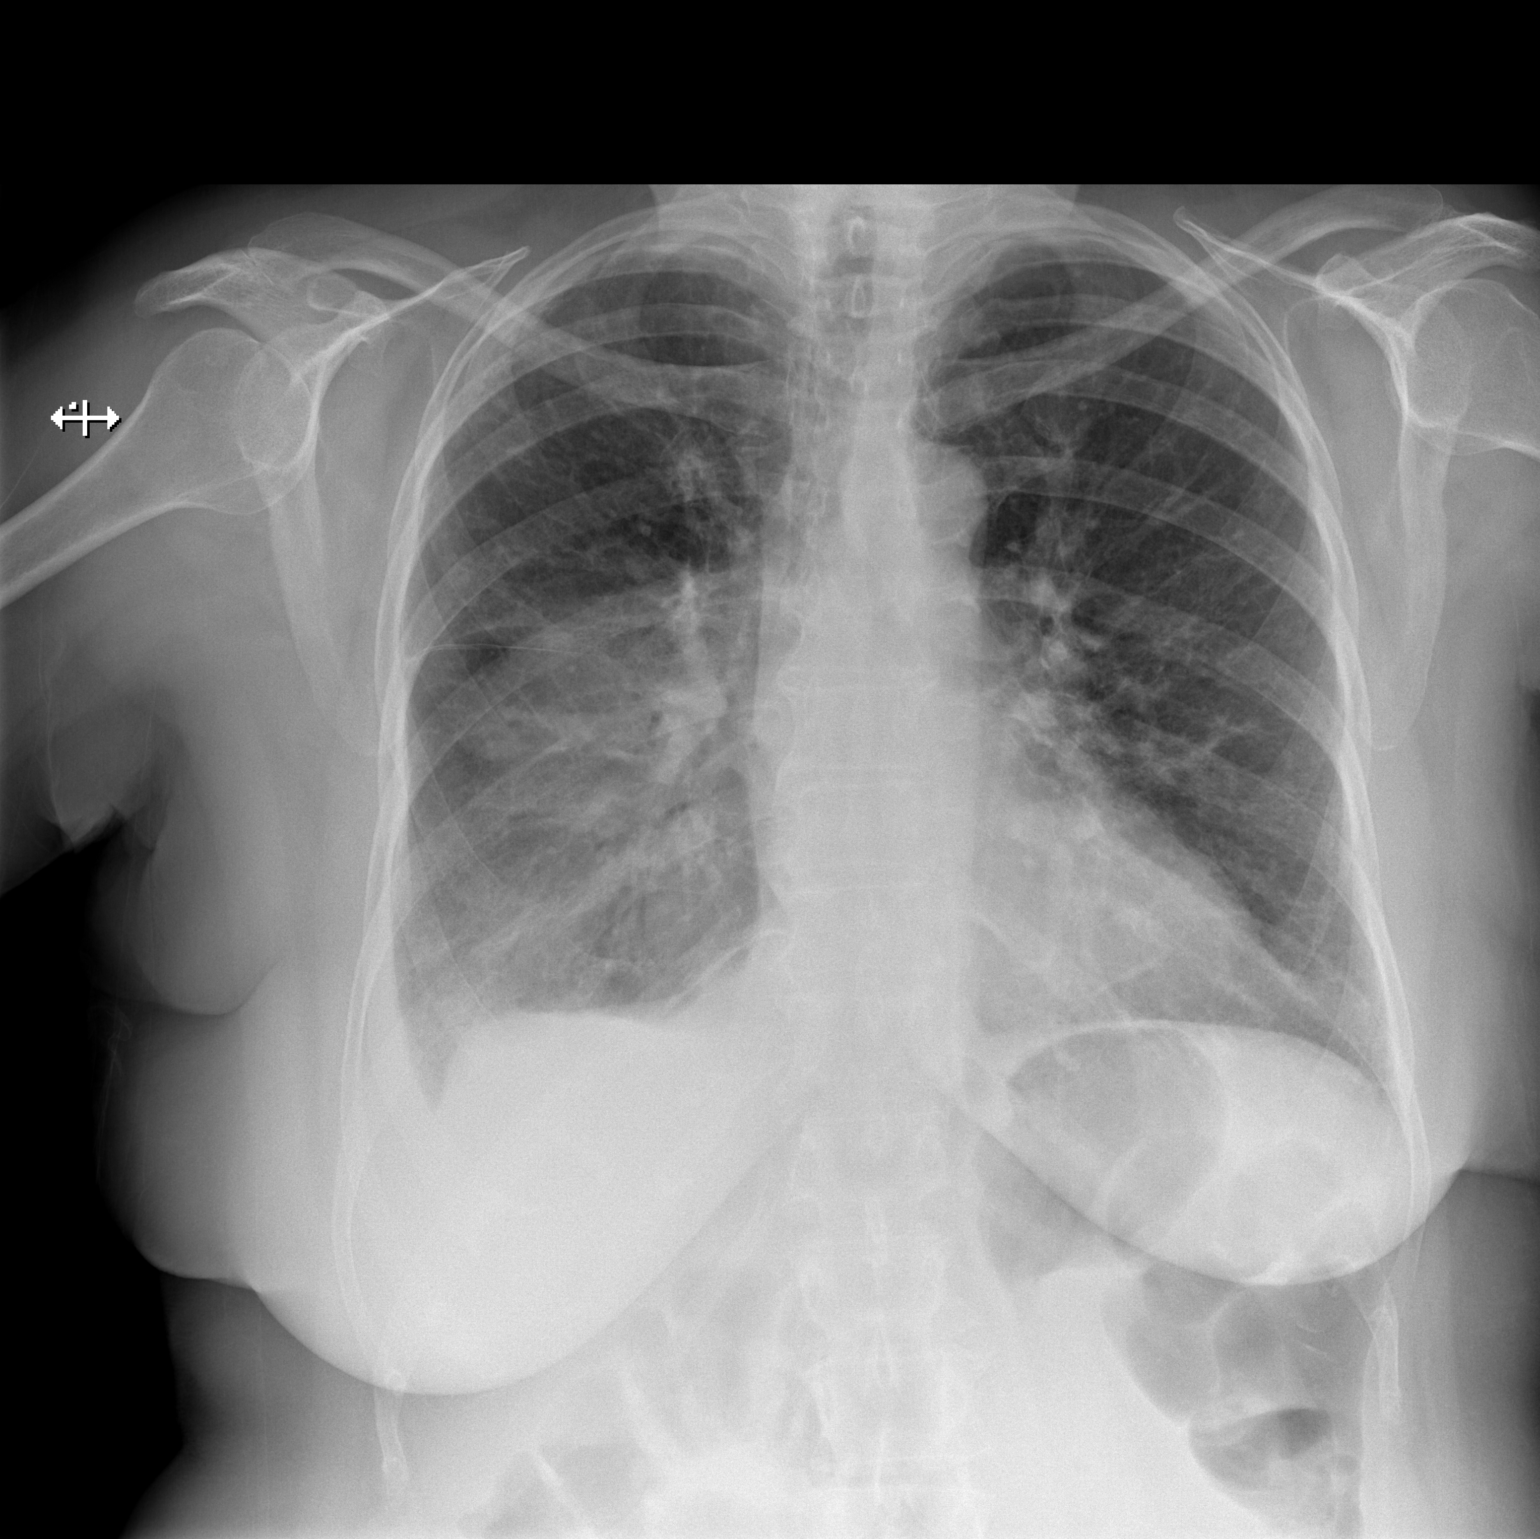

[w chest lat]
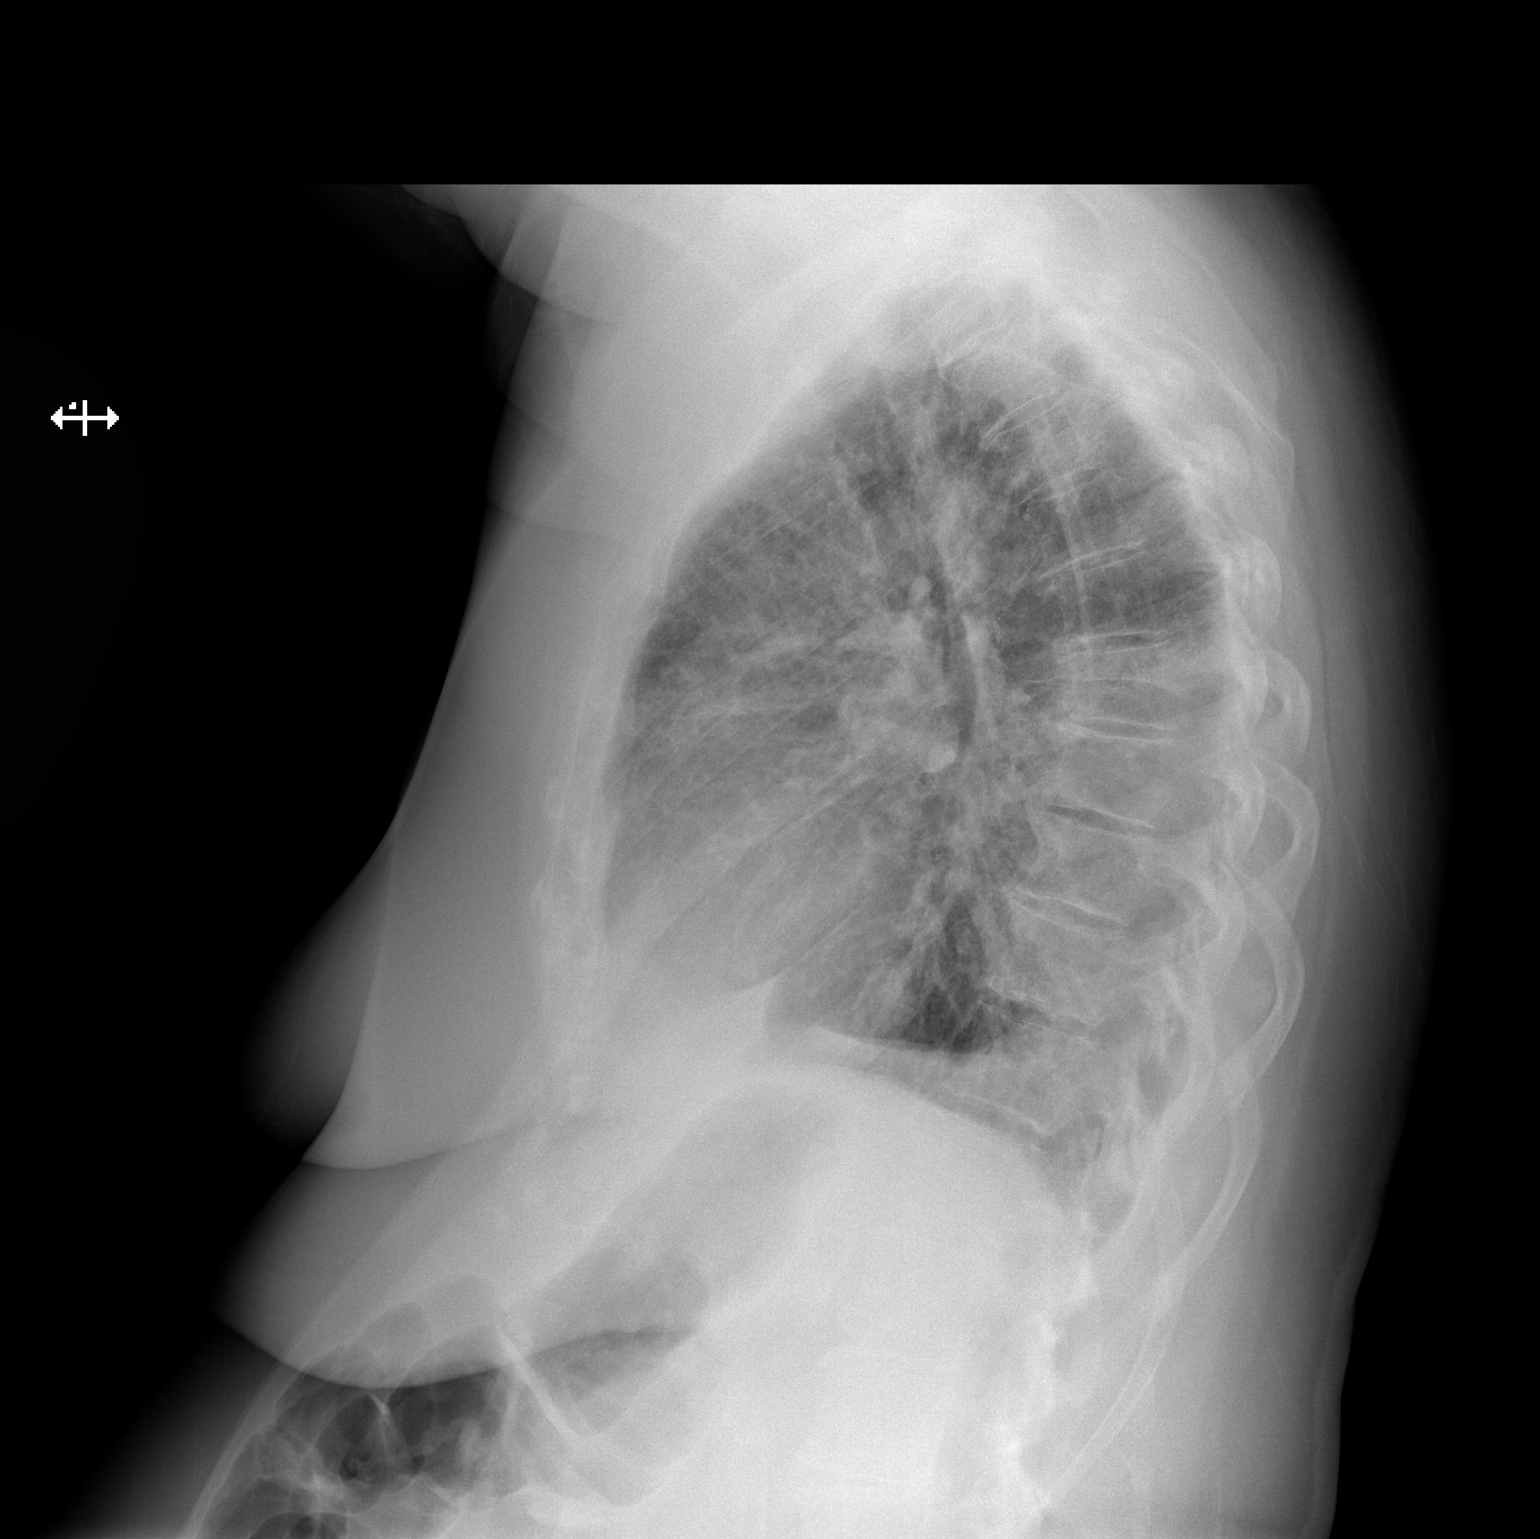

[2 of 2 positions shown; findings below may reference images not displayed]

FINDINGS: Extensive masslike opacity involving the right lower lobe with small
right effusion as well. Remaining portions of the lungs are grossly
clear. Opacity silhouettes portion of the right heart border.
Remaining cardiomediastinal contours are unremarkable. No acute
osseous or soft tissue abnormality.
IMPRESSION: Masslike opacity involving the right lower lobe extending to the
pleura. Correlate with patient's reported history of malignancy. No
comparison images are available in the system at time of
interpretation.

## 2020-09-27 IMAGING — DX DG CHEST 1V PORT
1 series · 1 of 1 positions shown · non-contrast
Comparison: 06/06/2019

CLINICAL DATA: Status post right VATS

EXAM:
PORTABLE CHEST 1 VIEW

[chest]
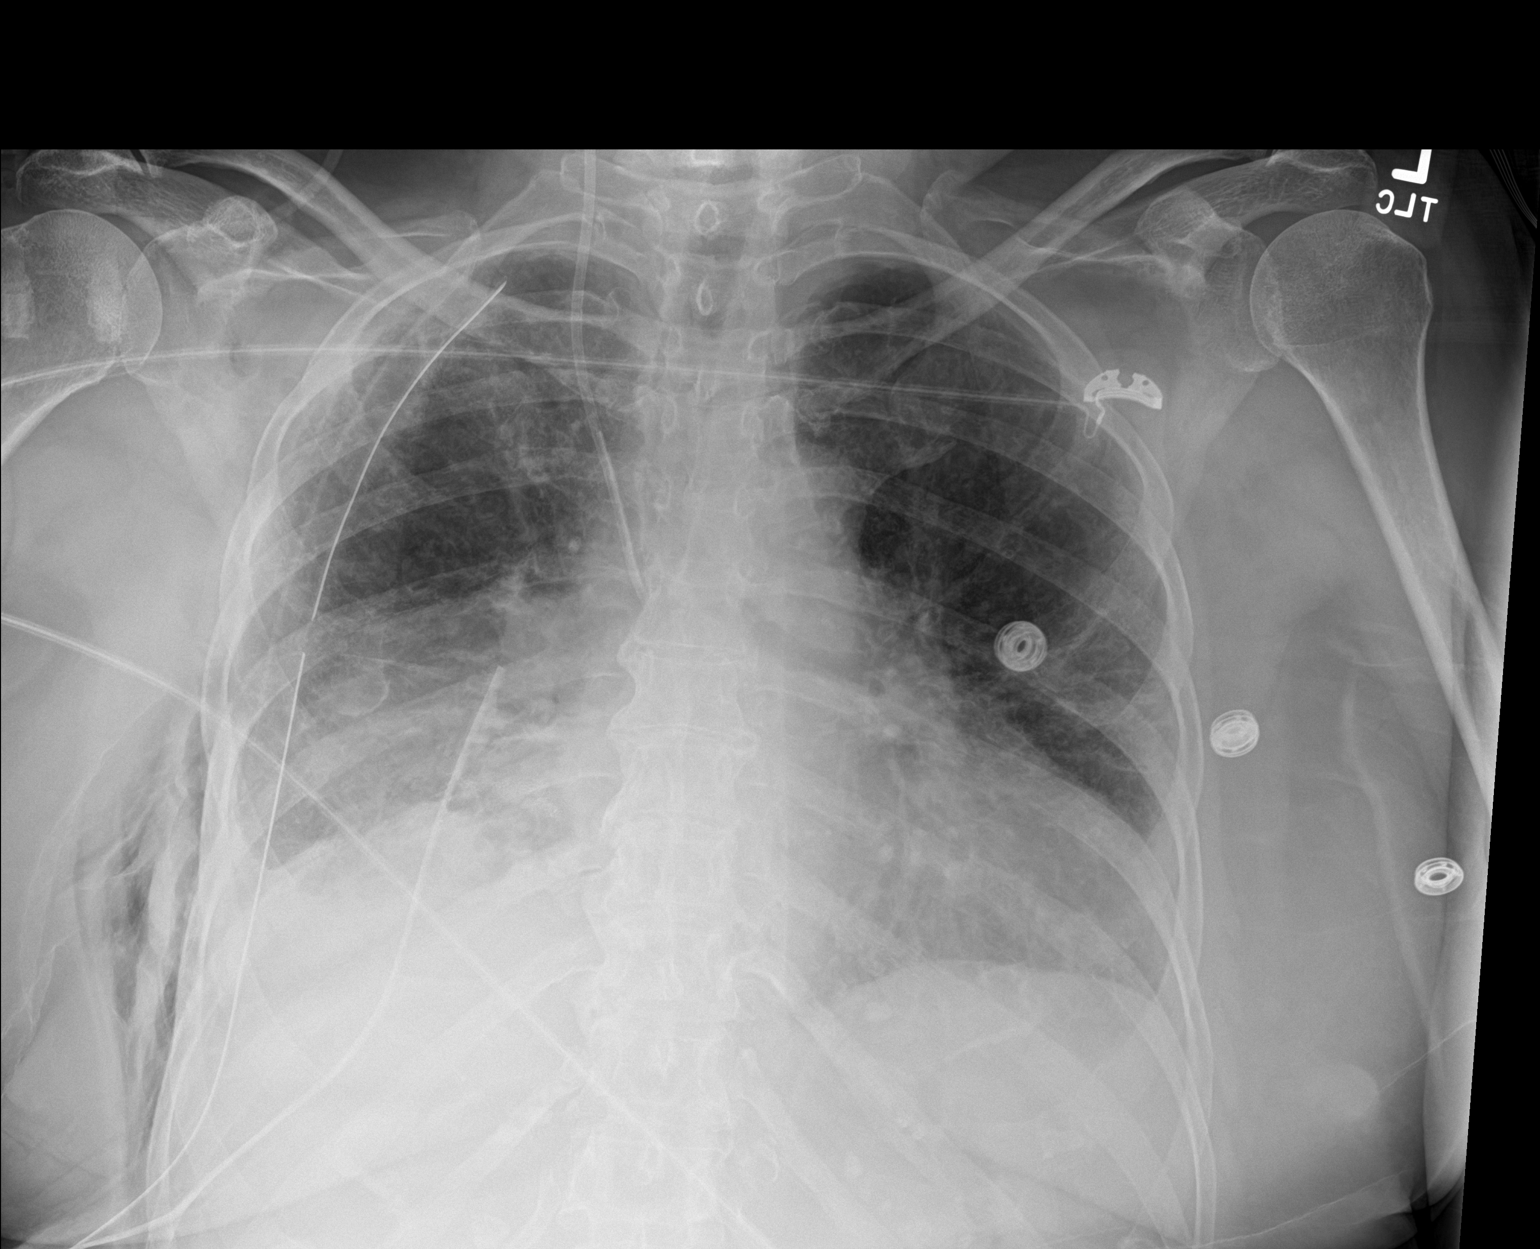

[1 of 1 positions shown; findings below may reference images not displayed]

FINDINGS: Two right chest tubes are noted in place. No pneumothorax is seen.
Right jugular central line is seen. Cardiac shadow is stable.
Persistent right basilar mass lesion is seen. Tiny right pleural
effusion is noted. Left lung is clear.
IMPRESSION: Stable right lower lobe mass.

No pneumothorax following right-sided VATS. Chest tubes are noted in
place.

## 2020-09-28 IMAGING — DX DG CHEST 1V PORT
1 series · 1 of 1 positions shown · non-contrast
Comparison: 06/10/2019

CLINICAL DATA: Chest tube placement, status post lung surgery

EXAM:
PORTABLE CHEST 1 VIEW

[chest ap]
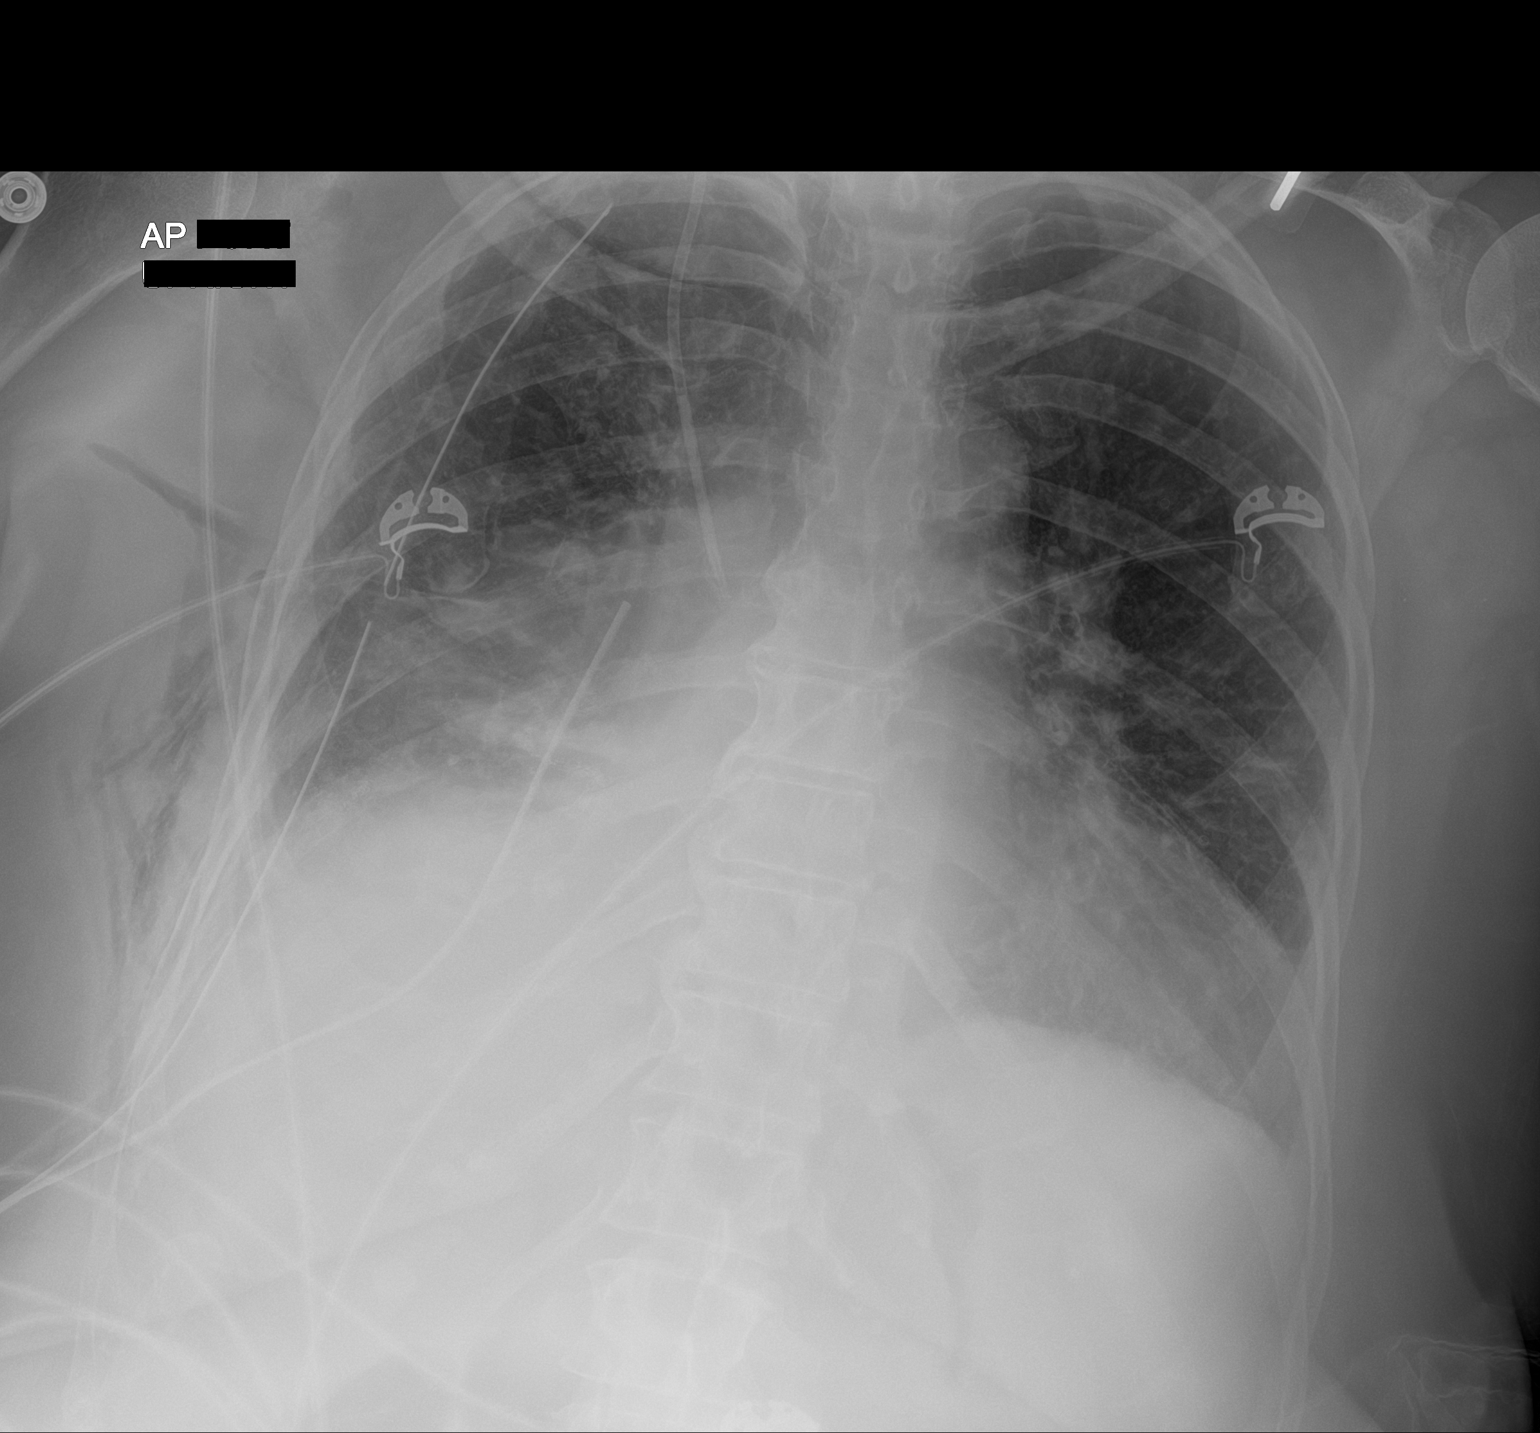

[1 of 1 positions shown; findings below may reference images not displayed]

FINDINGS: Right subclavian central venous catheter with the tip projecting
over the SVC. Two right-sided chest tubes in unchanged position.
Small right pleural effusion and right basilar atelectasis. No
pneumothorax. Left lung is clear. Stable cardiomediastinal
silhouette. No aggressive osseous lesion. Right lateral chest wall
soft tissue emphysema.
IMPRESSION: 1. Right subclavian central venous catheter with the tip projecting
over the SVC.
2. Two right-sided chest tubes in unchanged position. No
pneumothorax. Trace right pleural effusion and right basilar
atelectasis.

## 2020-09-30 IMAGING — DX DG CHEST 1V PORT
1 series · 1 of 1 positions shown · non-contrast
Comparison: Yesterday

CLINICAL DATA: Chest tube and pleural effusion

EXAM:
PORTABLE CHEST 1 VIEW

[chest ap]
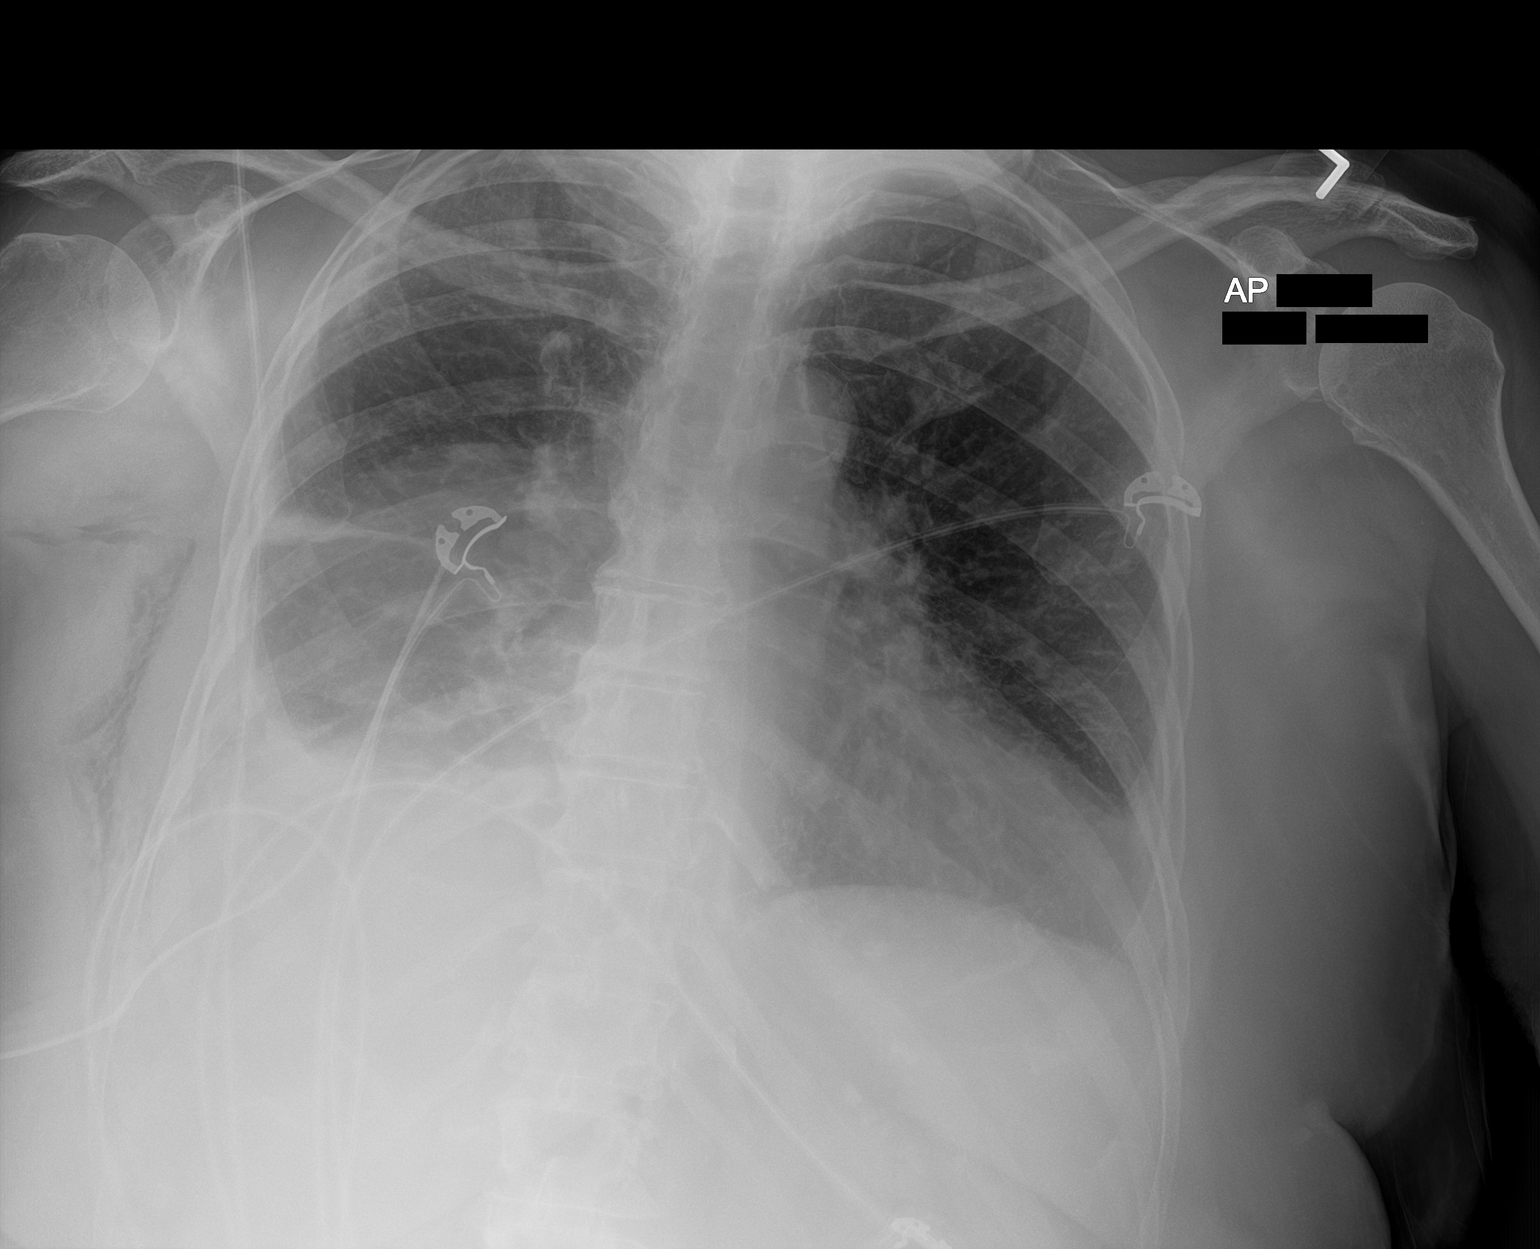

[1 of 1 positions shown; findings below may reference images not displayed]

FINDINGS: The right chest tube has been removed. No visible pneumothorax.
Stable right pleural effusion and atelectasis. Right IJ sheath has
also been removed. Cardiomegaly.
IMPRESSION: Chest tube removal with no visible pneumothorax with and stable
pleural effusion.

## 2020-10-01 IMAGING — DX DG CHEST 1V PORT
1 series · 1 of 1 positions shown · non-contrast
Comparison: Chest x-ray 06/13/2019.

CLINICAL DATA: 78-year-old female status post chest tube removal.

EXAM:
PORTABLE CHEST 1 VIEW

[chest ap]
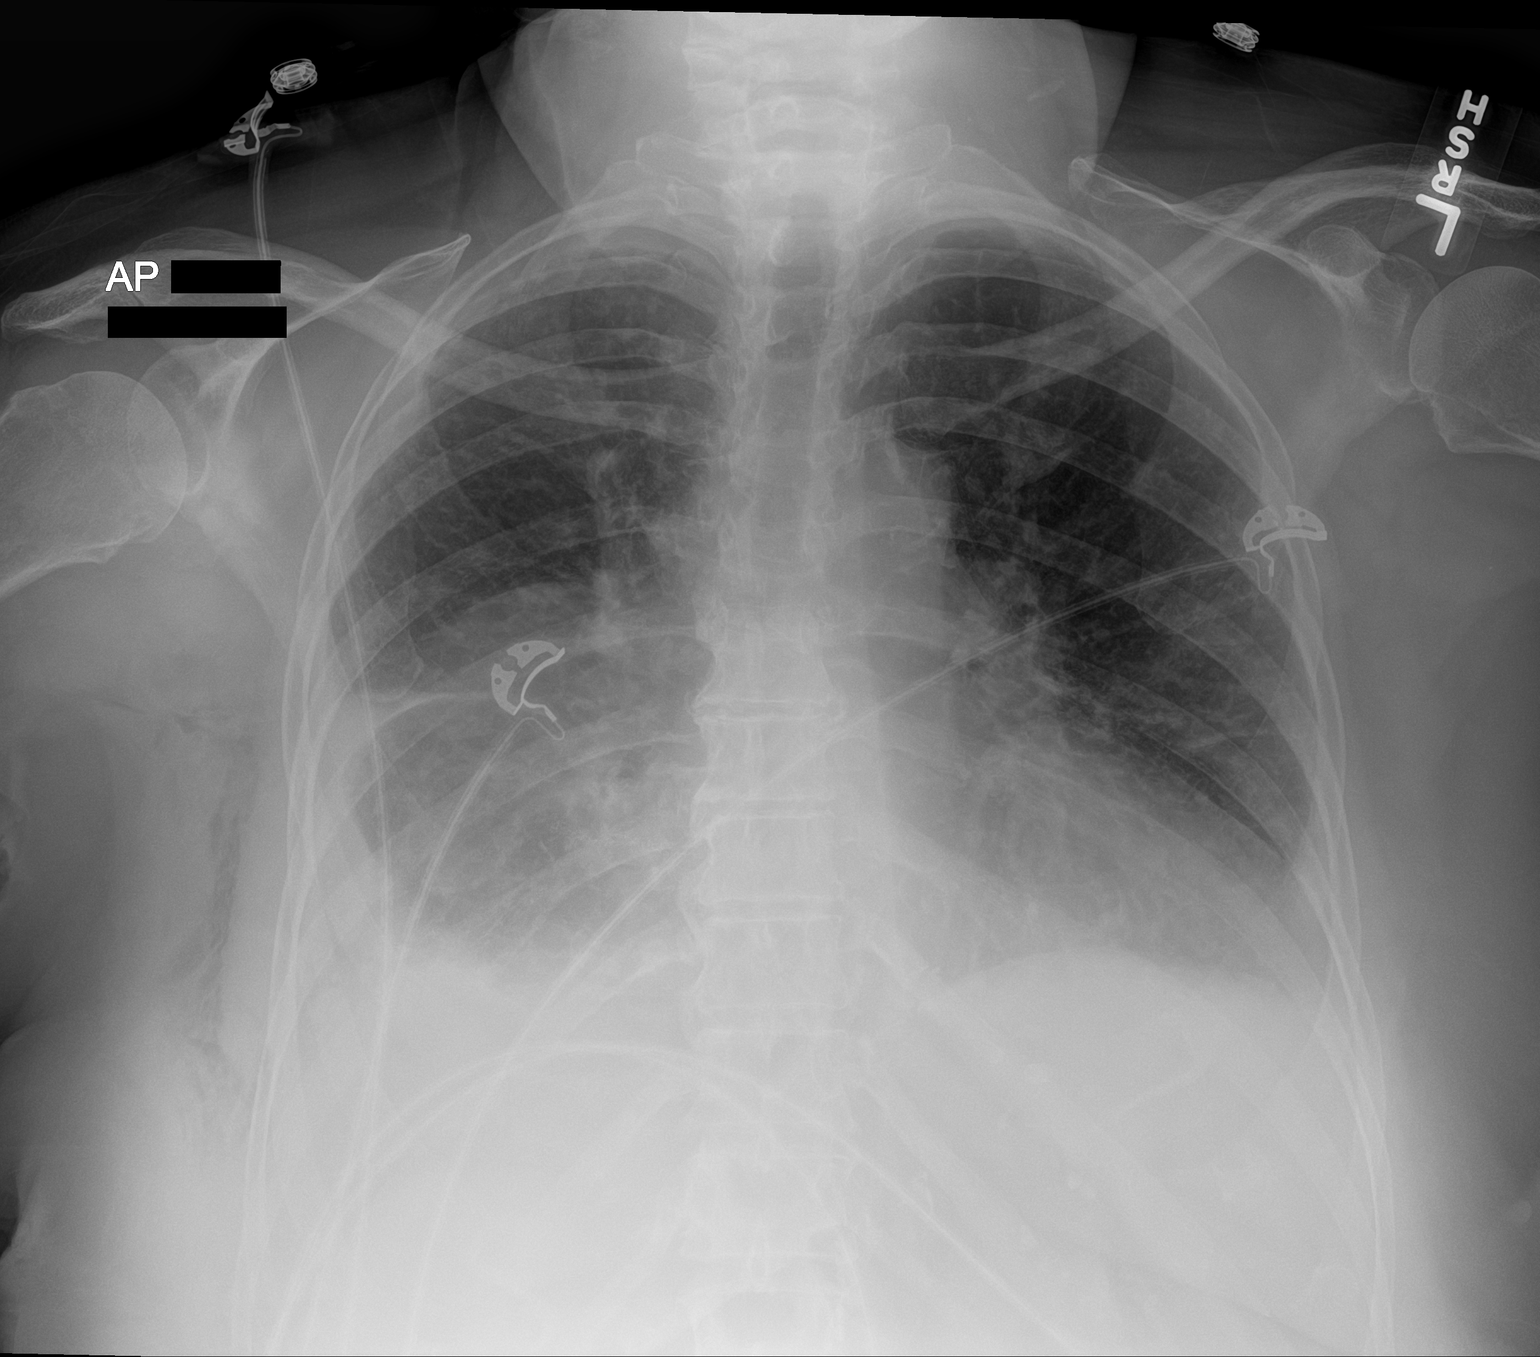

[1 of 1 positions shown; findings below may reference images not displayed]

FINDINGS: Subcutaneous emphysema in the right chest wall. Previously noted
right-sided chest tube has been removed. Small right pleural
effusion. Opacities at the right lung base may reflect areas of
atelectasis and/or consolidation. Left lung is clear. No definite
left pleural effusion. No evidence of pulmonary edema. Heart size
appears borderline enlarged, likely accentuated by low lung volumes.
Upper mediastinal contours are within normal limits.
IMPRESSION: 1. Removal of right-sided chest tube. Otherwise, radiographic
appearance the chest is essentially unchanged, as above.
